# Patient Record
Sex: Male | Born: 1955 | Race: Black or African American | Hispanic: No | Marital: Single | Smoking: Never smoker
Health system: Southern US, Community
[De-identification: ages and names within clinical notes are randomized; demographics above are authoritative.]

## PROBLEM LIST (undated history)

## (undated) DIAGNOSIS — I498 Other specified cardiac arrhythmias: Secondary | ICD-10-CM

## (undated) DIAGNOSIS — R5383 Other fatigue: Secondary | ICD-10-CM

## (undated) DIAGNOSIS — M1710 Unilateral primary osteoarthritis, unspecified knee: Secondary | ICD-10-CM

## (undated) DIAGNOSIS — R609 Edema, unspecified: Secondary | ICD-10-CM

## (undated) DIAGNOSIS — IMO0002 Reserved for concepts with insufficient information to code with codable children: Secondary | ICD-10-CM

## (undated) DIAGNOSIS — I10 Essential (primary) hypertension: Secondary | ICD-10-CM

## (undated) DIAGNOSIS — M25519 Pain in unspecified shoulder: Secondary | ICD-10-CM

## (undated) DIAGNOSIS — F519 Sleep disorder not due to a substance or known physiological condition, unspecified: Secondary | ICD-10-CM

## (undated) DIAGNOSIS — K219 Gastro-esophageal reflux disease without esophagitis: Secondary | ICD-10-CM

## (undated) DIAGNOSIS — M25562 Pain in left knee: Secondary | ICD-10-CM

## (undated) DIAGNOSIS — F209 Schizophrenia, unspecified: Secondary | ICD-10-CM

## (undated) DIAGNOSIS — M25561 Pain in right knee: Secondary | ICD-10-CM

## (undated) DIAGNOSIS — Z79899 Other long term (current) drug therapy: Secondary | ICD-10-CM

## (undated) HISTORY — DX: Schizophrenia, unspecified: F20.9

## (undated) HISTORY — DX: Unilateral primary osteoarthritis, unspecified knee: M17.10

## (undated) HISTORY — DX: Other fatigue: R53.83

## (undated) HISTORY — DX: Gastro-esophageal reflux disease without esophagitis: K21.9

## (undated) HISTORY — PX: HERNIA REPAIR: SHX51

## (undated) HISTORY — DX: Edema, unspecified: R60.9

## (undated) HISTORY — DX: Pain in left knee: M25.562

## (undated) HISTORY — PX: INGUINAL HERNIA REPAIR: SHX194

## (undated) HISTORY — DX: Sleep disorder not due to a substance or known physiological condition, unspecified: F51.9

## (undated) HISTORY — DX: Pain in unspecified shoulder: M25.519

## (undated) HISTORY — DX: Reserved for concepts with insufficient information to code with codable children: IMO0002

## (undated) HISTORY — DX: Other long term (current) drug therapy: Z79.899

## (undated) HISTORY — DX: Other specified cardiac arrhythmias: I49.8

## (undated) HISTORY — DX: Pain in right knee: M25.561

---

## 1999-10-15 ENCOUNTER — Emergency Department (HOSPITAL_COMMUNITY): Admission: EM | Admit: 1999-10-15 | Discharge: 1999-10-15 | Payer: Self-pay | Admitting: Emergency Medicine

## 1999-11-29 ENCOUNTER — Emergency Department (HOSPITAL_COMMUNITY): Admission: EM | Admit: 1999-11-29 | Discharge: 1999-11-29 | Payer: Self-pay | Admitting: Emergency Medicine

## 2000-03-08 ENCOUNTER — Ambulatory Visit (HOSPITAL_COMMUNITY): Admission: RE | Admit: 2000-03-08 | Discharge: 2000-03-08 | Payer: Self-pay | Admitting: Family Medicine

## 2000-03-08 ENCOUNTER — Encounter: Admission: RE | Admit: 2000-03-08 | Discharge: 2000-03-08 | Payer: Self-pay | Admitting: Family Medicine

## 2001-04-25 ENCOUNTER — Encounter: Admission: RE | Admit: 2001-04-25 | Discharge: 2001-04-25 | Payer: Self-pay | Admitting: Family Medicine

## 2001-05-22 ENCOUNTER — Encounter: Admission: RE | Admit: 2001-05-22 | Discharge: 2001-05-22 | Payer: Self-pay | Admitting: Family Medicine

## 2001-08-30 ENCOUNTER — Encounter: Admission: RE | Admit: 2001-08-30 | Discharge: 2001-08-30 | Payer: Self-pay | Admitting: Family Medicine

## 2002-09-17 ENCOUNTER — Encounter: Admission: RE | Admit: 2002-09-17 | Discharge: 2002-09-17 | Payer: Self-pay | Admitting: Family Medicine

## 2003-02-07 ENCOUNTER — Emergency Department (HOSPITAL_COMMUNITY): Admission: EM | Admit: 2003-02-07 | Discharge: 2003-02-07 | Payer: Self-pay | Admitting: Emergency Medicine

## 2003-02-08 ENCOUNTER — Encounter: Admission: RE | Admit: 2003-02-08 | Discharge: 2003-02-08 | Payer: Self-pay | Admitting: Family Medicine

## 2003-02-22 ENCOUNTER — Encounter: Admission: RE | Admit: 2003-02-22 | Discharge: 2003-02-22 | Payer: Self-pay | Admitting: Family Medicine

## 2006-03-25 ENCOUNTER — Ambulatory Visit: Payer: Self-pay | Admitting: Family Medicine

## 2006-04-25 ENCOUNTER — Ambulatory Visit: Payer: Self-pay | Admitting: Family Medicine

## 2006-09-08 DIAGNOSIS — Z8679 Personal history of other diseases of the circulatory system: Secondary | ICD-10-CM

## 2006-09-08 DIAGNOSIS — F209 Schizophrenia, unspecified: Secondary | ICD-10-CM | POA: Insufficient documentation

## 2006-09-08 DIAGNOSIS — Z87898 Personal history of other specified conditions: Secondary | ICD-10-CM | POA: Insufficient documentation

## 2007-08-29 ENCOUNTER — Ambulatory Visit: Payer: Self-pay | Admitting: Family Medicine

## 2007-08-29 ENCOUNTER — Encounter (INDEPENDENT_AMBULATORY_CARE_PROVIDER_SITE_OTHER): Payer: Self-pay | Admitting: Family Medicine

## 2007-08-29 DIAGNOSIS — F519 Sleep disorder not due to a substance or known physiological condition, unspecified: Secondary | ICD-10-CM | POA: Insufficient documentation

## 2007-08-29 LAB — CONVERTED CEMR LAB
Direct LDL: 108 mg/dL — ABNORMAL HIGH
Glucose, Bld: 121 mg/dL — ABNORMAL HIGH (ref 70–99)

## 2007-09-07 ENCOUNTER — Encounter (INDEPENDENT_AMBULATORY_CARE_PROVIDER_SITE_OTHER): Payer: Self-pay | Admitting: Family Medicine

## 2008-02-06 ENCOUNTER — Encounter (INDEPENDENT_AMBULATORY_CARE_PROVIDER_SITE_OTHER): Payer: Self-pay | Admitting: Family Medicine

## 2008-02-09 ENCOUNTER — Ambulatory Visit: Payer: Self-pay | Admitting: Family Medicine

## 2008-02-09 ENCOUNTER — Ambulatory Visit (HOSPITAL_COMMUNITY): Admission: RE | Admit: 2008-02-09 | Discharge: 2008-02-09 | Payer: Self-pay | Admitting: Family Medicine

## 2008-02-09 DIAGNOSIS — K4090 Unilateral inguinal hernia, without obstruction or gangrene, not specified as recurrent: Secondary | ICD-10-CM | POA: Insufficient documentation

## 2008-02-20 ENCOUNTER — Encounter (INDEPENDENT_AMBULATORY_CARE_PROVIDER_SITE_OTHER): Payer: Self-pay | Admitting: Family Medicine

## 2008-03-06 ENCOUNTER — Ambulatory Visit: Payer: Self-pay | Admitting: Family Medicine

## 2008-03-06 ENCOUNTER — Encounter (INDEPENDENT_AMBULATORY_CARE_PROVIDER_SITE_OTHER): Payer: Self-pay | Admitting: Family Medicine

## 2008-03-06 LAB — CONVERTED CEMR LAB: PSA: 1.07 ng/mL (ref 0.10–4.00)

## 2008-03-07 ENCOUNTER — Telehealth (INDEPENDENT_AMBULATORY_CARE_PROVIDER_SITE_OTHER): Payer: Self-pay | Admitting: *Deleted

## 2008-03-13 ENCOUNTER — Encounter (INDEPENDENT_AMBULATORY_CARE_PROVIDER_SITE_OTHER): Payer: Self-pay | Admitting: Family Medicine

## 2008-04-02 ENCOUNTER — Encounter (INDEPENDENT_AMBULATORY_CARE_PROVIDER_SITE_OTHER): Payer: Self-pay | Admitting: Family Medicine

## 2008-04-18 ENCOUNTER — Telehealth (INDEPENDENT_AMBULATORY_CARE_PROVIDER_SITE_OTHER): Payer: Self-pay | Admitting: Family Medicine

## 2008-05-14 ENCOUNTER — Telehealth: Payer: Self-pay | Admitting: *Deleted

## 2008-05-27 ENCOUNTER — Encounter (INDEPENDENT_AMBULATORY_CARE_PROVIDER_SITE_OTHER): Payer: Self-pay | Admitting: Family Medicine

## 2008-05-27 ENCOUNTER — Ambulatory Visit: Payer: Self-pay | Admitting: Family Medicine

## 2008-05-27 ENCOUNTER — Encounter: Admission: RE | Admit: 2008-05-27 | Discharge: 2008-05-27 | Payer: Self-pay | Admitting: Family Medicine

## 2008-05-27 DIAGNOSIS — M1710 Unilateral primary osteoarthritis, unspecified knee: Secondary | ICD-10-CM | POA: Insufficient documentation

## 2008-05-27 DIAGNOSIS — IMO0002 Reserved for concepts with insufficient information to code with codable children: Secondary | ICD-10-CM | POA: Insufficient documentation

## 2008-05-29 ENCOUNTER — Encounter (INDEPENDENT_AMBULATORY_CARE_PROVIDER_SITE_OTHER): Payer: Self-pay | Admitting: Family Medicine

## 2008-06-24 ENCOUNTER — Telehealth (INDEPENDENT_AMBULATORY_CARE_PROVIDER_SITE_OTHER): Payer: Self-pay | Admitting: Family Medicine

## 2008-07-18 ENCOUNTER — Telehealth: Payer: Self-pay | Admitting: *Deleted

## 2008-07-24 ENCOUNTER — Ambulatory Visit: Payer: Self-pay | Admitting: Family Medicine

## 2008-07-31 ENCOUNTER — Encounter (INDEPENDENT_AMBULATORY_CARE_PROVIDER_SITE_OTHER): Payer: Self-pay | Admitting: Family Medicine

## 2008-07-31 ENCOUNTER — Ambulatory Visit: Payer: Self-pay | Admitting: Family Medicine

## 2008-07-31 LAB — CONVERTED CEMR LAB
Cholesterol: 201 mg/dL — ABNORMAL HIGH (ref 0–200)
HDL: 67 mg/dL (ref >39)
LDL Cholesterol: 123 mg/dL — ABNORMAL HIGH (ref 0–99)
Total CHOL/HDL Ratio: 3
Triglycerides: 53 mg/dL (ref <150)
VLDL: 11 mg/dL (ref 0–40)

## 2008-08-01 ENCOUNTER — Encounter (INDEPENDENT_AMBULATORY_CARE_PROVIDER_SITE_OTHER): Payer: Self-pay | Admitting: Family Medicine

## 2008-11-28 ENCOUNTER — Ambulatory Visit: Payer: Self-pay | Admitting: Family Medicine

## 2008-11-28 ENCOUNTER — Encounter (INDEPENDENT_AMBULATORY_CARE_PROVIDER_SITE_OTHER): Payer: Self-pay | Admitting: Family Medicine

## 2008-11-28 DIAGNOSIS — R609 Edema, unspecified: Secondary | ICD-10-CM | POA: Insufficient documentation

## 2008-11-28 LAB — CONVERTED CEMR LAB
Bilirubin Urine: NEGATIVE
Blood in Urine, dipstick: NEGATIVE
Glucose, Urine, Semiquant: NEGATIVE
Ketones, urine, test strip: NEGATIVE
Nitrite: NEGATIVE
Protein, U semiquant: NEGATIVE
Specific Gravity, Urine: 1.025
Urobilinogen, UA: 1
WBC Urine, dipstick: NEGATIVE
pH: 7

## 2008-11-29 LAB — CONVERTED CEMR LAB
ALT: 20 units/L (ref 0–53)
AST: 22 units/L (ref 0–37)
Albumin: 4 g/dL (ref 3.5–5.2)
Alkaline Phosphatase: 83 units/L (ref 39–117)
BUN: 10 mg/dL (ref 6–23)
CO2: 23 meq/L (ref 19–32)
Calcium: 9.4 mg/dL (ref 8.4–10.5)
Chloride: 107 meq/L (ref 96–112)
Creatinine, Ser: 0.91 mg/dL (ref 0.40–1.50)
Glucose, Bld: 87 mg/dL (ref 70–99)
HCT: 39.9 % (ref 39.0–52.0)
Hemoglobin: 13.9 g/dL (ref 13.0–17.0)
MCHC: 34.8 g/dL (ref 30.0–36.0)
MCV: 87.3 fL (ref 78.0–100.0)
Platelets: 257 10*3/uL (ref 150–400)
Potassium: 4.7 meq/L (ref 3.5–5.3)
RBC: 4.57 M/uL (ref 4.22–5.81)
RDW: 13.2 % (ref 11.5–15.5)
Sodium: 141 meq/L (ref 135–145)
TSH: 1.104 microintl units/mL (ref 0.350–4.500)
Total Bilirubin: 0.9 mg/dL (ref 0.3–1.2)
Total Protein: 6.8 g/dL (ref 6.0–8.3)
WBC: 5.3 10*3/uL (ref 4.0–10.5)

## 2008-12-16 ENCOUNTER — Ambulatory Visit: Payer: Self-pay | Admitting: Family Medicine

## 2008-12-18 ENCOUNTER — Encounter (INDEPENDENT_AMBULATORY_CARE_PROVIDER_SITE_OTHER): Payer: Self-pay | Admitting: Family Medicine

## 2009-05-02 ENCOUNTER — Ambulatory Visit: Payer: Self-pay | Admitting: Family Medicine

## 2009-07-05 IMAGING — CR DG KNEE 1-2V*R*
2 series · 2 of 2 positions shown · non-contrast
Comparison: None

CLINICAL DATA: Bilateral knee pain.

RIGHT KNEE - 1-2 VIEW

[view not recorded (1 of 2)]
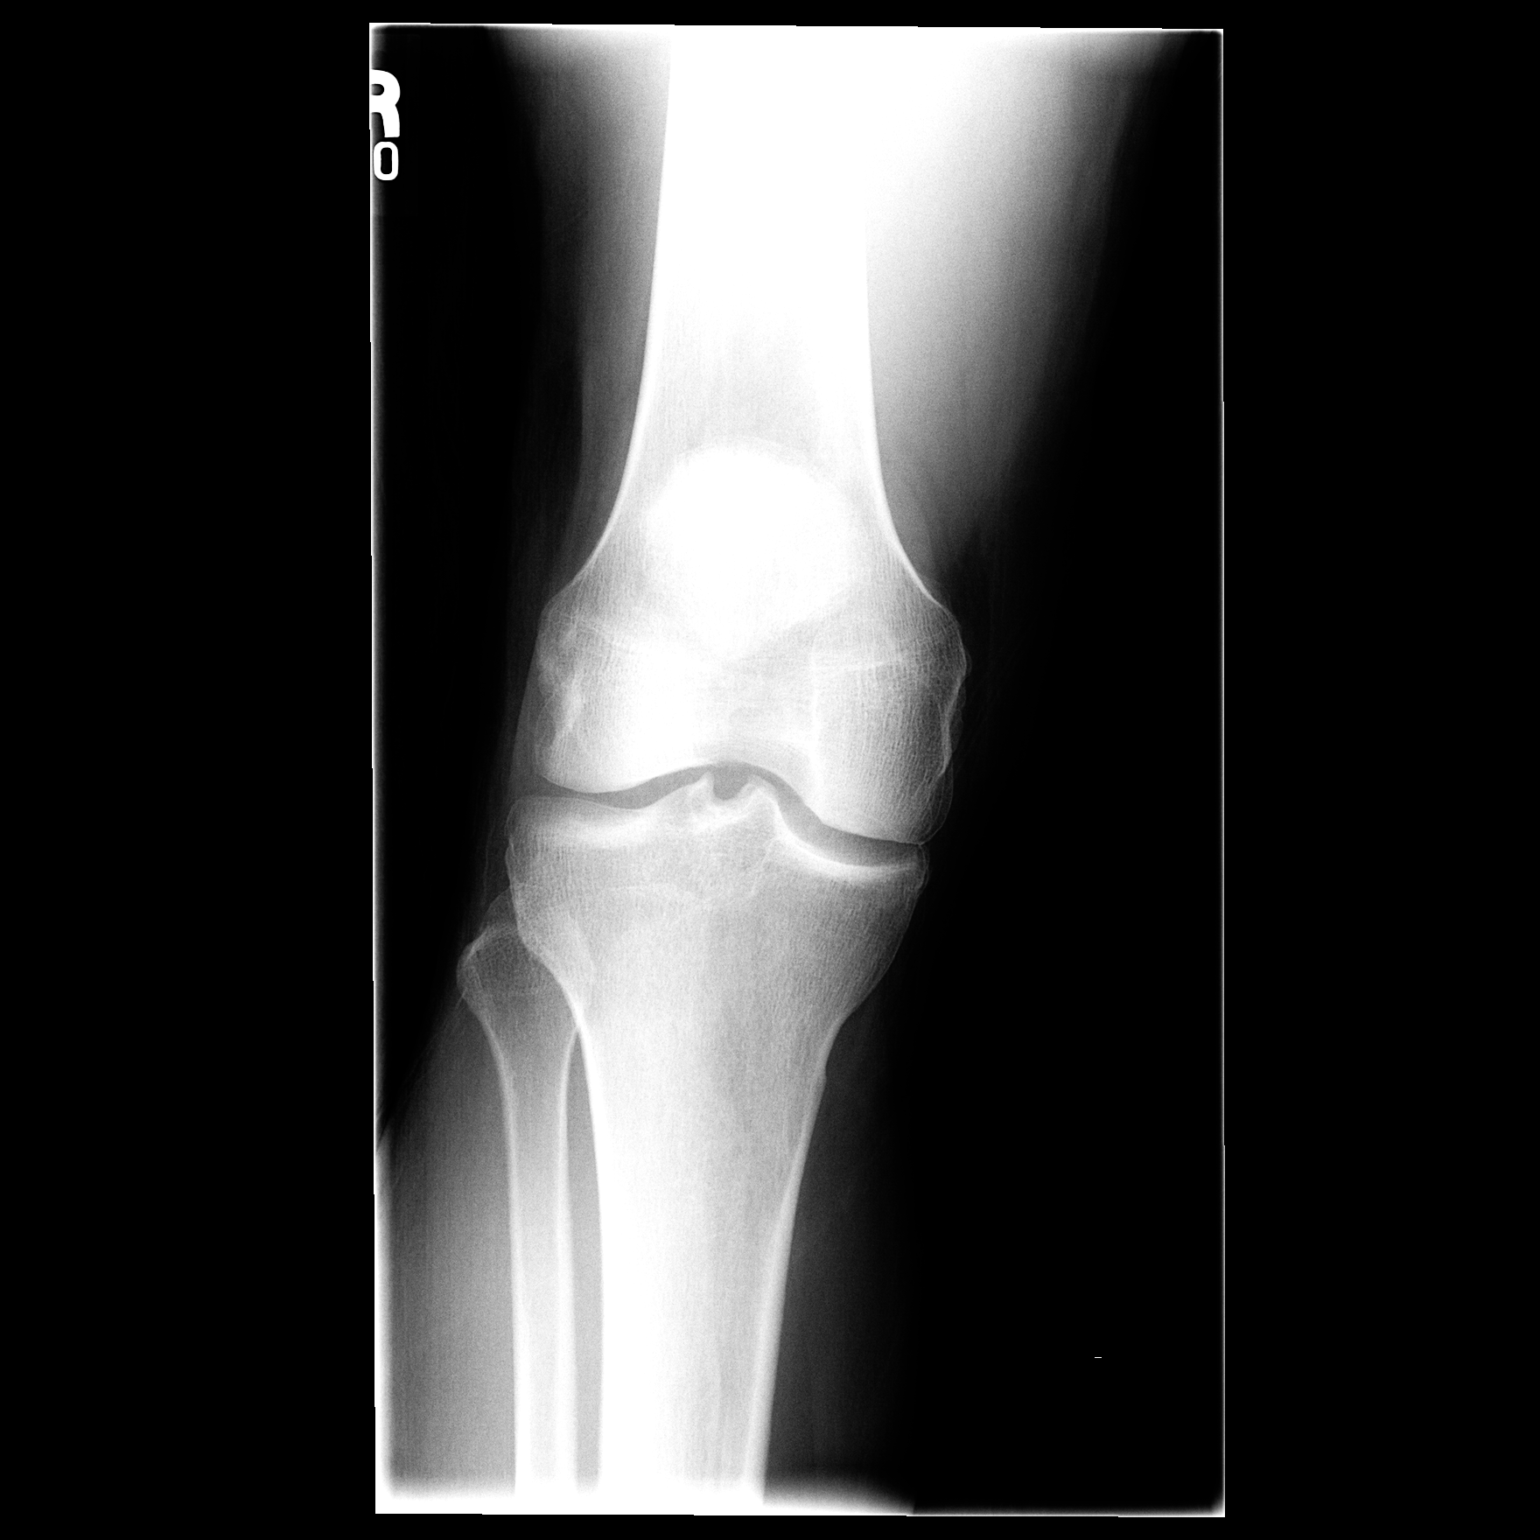

[view not recorded (2 of 2)]
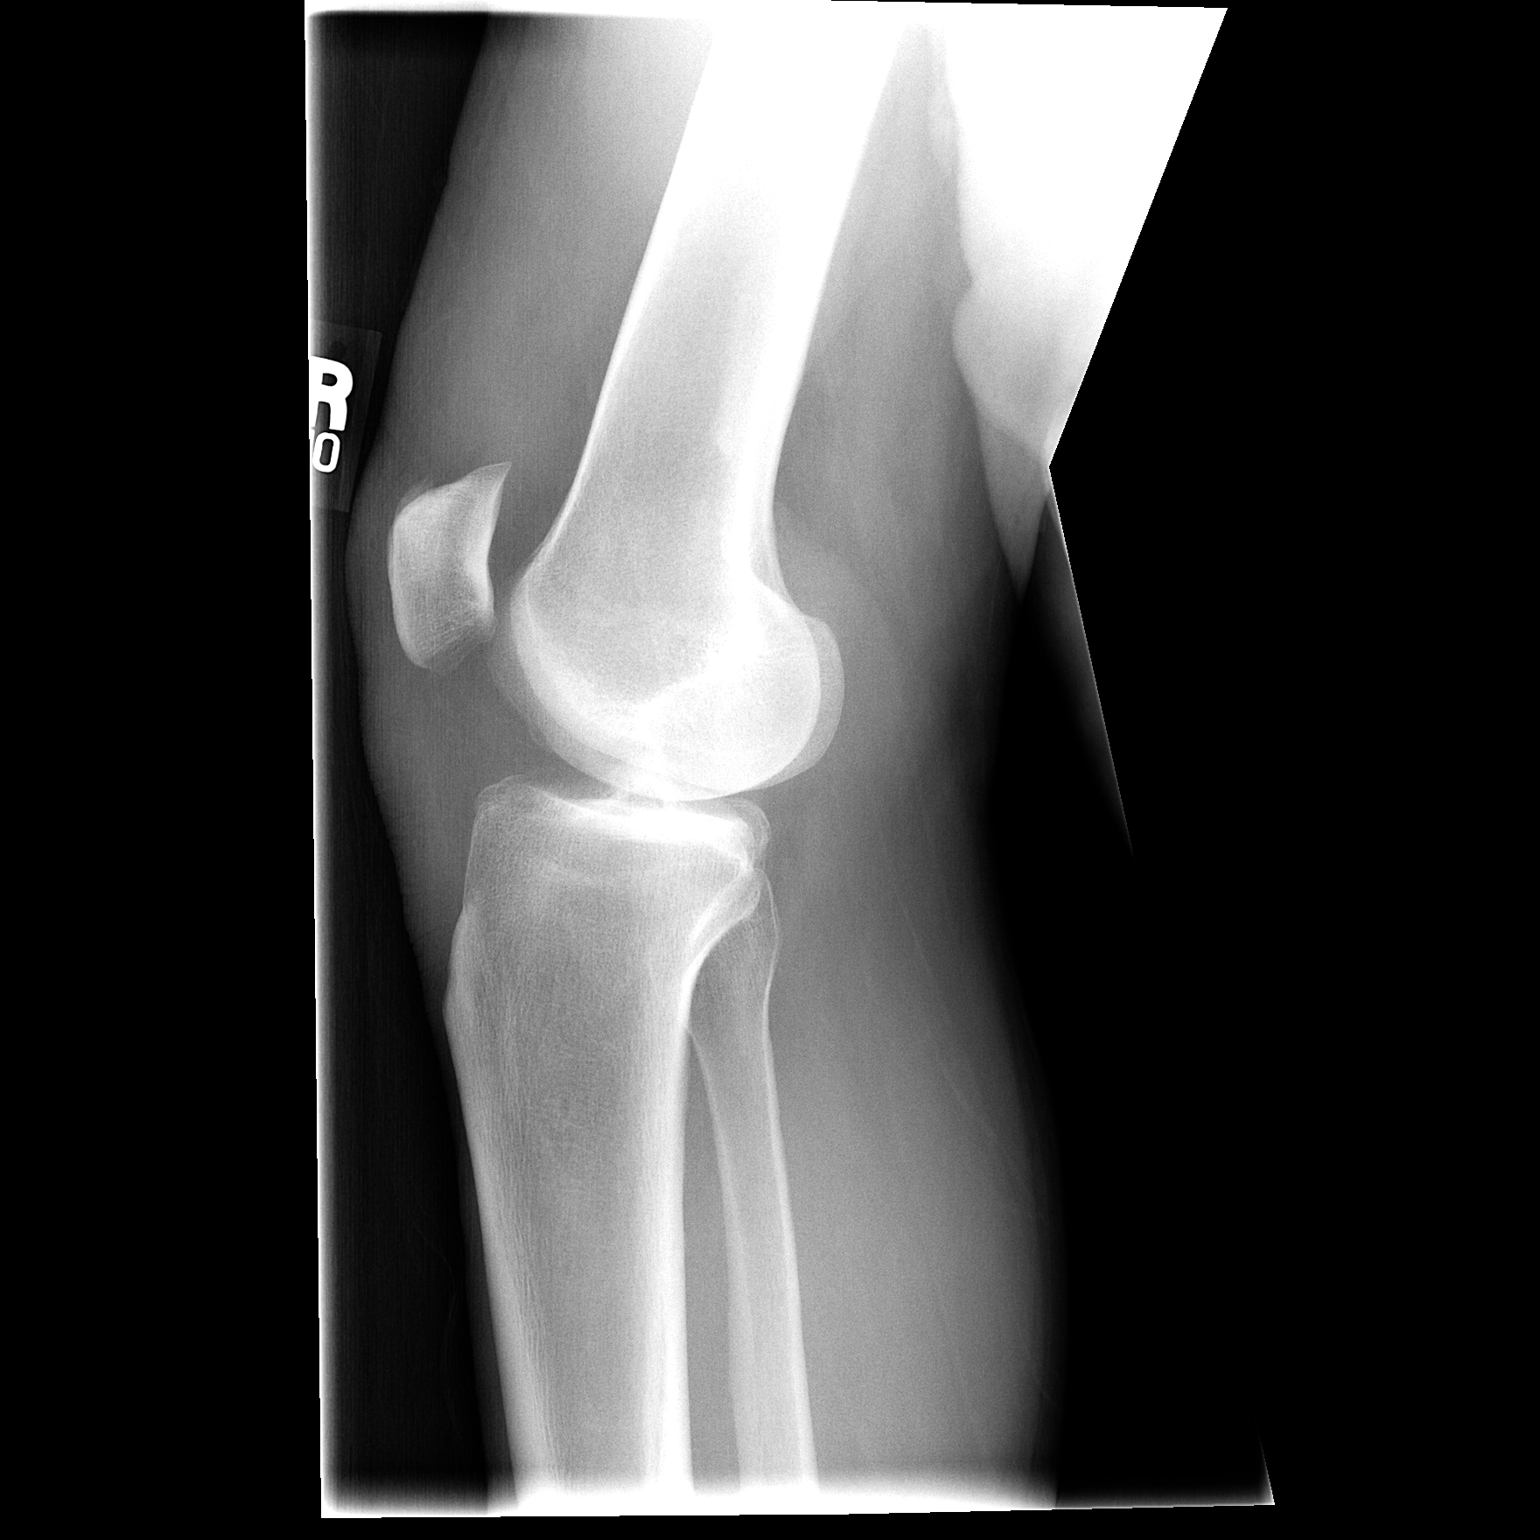

[2 of 2 positions shown; findings below may reference images not displayed]

FINDINGS: Standing views of the knees were obtained.

There is mild spurring of the tibial spines and early spurring of
the superior pole patella.  Medial and lateral joint spaces are
maintained.  There is no fracture or bony lesion. Question small
joint effusion.
IMPRESSION: Mild degenerative change.  No acute bony abnormality.

## 2009-11-22 ENCOUNTER — Encounter: Payer: Self-pay | Admitting: Family Medicine

## 2010-03-12 ENCOUNTER — Encounter: Payer: Self-pay | Admitting: Family Medicine

## 2010-03-12 ENCOUNTER — Ambulatory Visit: Payer: Self-pay | Admitting: Family Medicine

## 2010-03-12 LAB — CONVERTED CEMR LAB
ALT: 14 units/L (ref 0–53)
AST: 19 units/L (ref 0–37)
Albumin: 4.2 g/dL (ref 3.5–5.2)
Alkaline Phosphatase: 57 units/L (ref 39–117)
BUN: 10 mg/dL (ref 6–23)
CO2: 26 meq/L (ref 19–32)
Calcium: 9.1 mg/dL (ref 8.4–10.5)
Chloride: 105 meq/L (ref 96–112)
Creatinine, Ser: 0.95 mg/dL (ref 0.40–1.50)
Direct LDL: 97 mg/dL
Glucose, Bld: 82 mg/dL (ref 70–99)
HCT: 42.1 % (ref 39.0–52.0)
Hemoglobin: 15.2 g/dL (ref 13.0–17.0)
MCHC: 36.1 g/dL — ABNORMAL HIGH (ref 30.0–36.0)
MCV: 86.8 fL (ref 78.0–100.0)
PSA: 0.91 ng/mL (ref 0.10–4.00)
Platelets: 239 10*3/uL (ref 150–400)
Potassium: 4.6 meq/L (ref 3.5–5.3)
RBC: 4.85 M/uL (ref 4.22–5.81)
RDW: 12.9 % (ref 11.5–15.5)
Sodium: 141 meq/L (ref 135–145)
Total Bilirubin: 2 mg/dL — ABNORMAL HIGH (ref 0.3–1.2)
Total Protein: 6.2 g/dL (ref 6.0–8.3)
WBC: 5.4 10*3/uL (ref 4.0–10.5)

## 2010-04-05 ENCOUNTER — Encounter: Payer: Self-pay | Admitting: Family Medicine

## 2010-05-13 ENCOUNTER — Ambulatory Visit: Payer: Self-pay | Admitting: Family Medicine

## 2010-05-13 ENCOUNTER — Telehealth: Payer: Self-pay | Admitting: *Deleted

## 2010-05-14 ENCOUNTER — Emergency Department (HOSPITAL_COMMUNITY): Admission: EM | Admit: 2010-05-14 | Discharge: 2010-05-14 | Payer: Self-pay | Admitting: Emergency Medicine

## 2010-05-14 ENCOUNTER — Telehealth: Payer: Self-pay | Admitting: Family Medicine

## 2010-06-16 ENCOUNTER — Ambulatory Visit: Payer: Self-pay | Admitting: Family Medicine

## 2010-07-08 ENCOUNTER — Encounter: Payer: Self-pay | Admitting: Family Medicine

## 2010-07-17 ENCOUNTER — Other Ambulatory Visit: Payer: Self-pay

## 2010-07-17 ENCOUNTER — Ambulatory Visit
Admission: RE | Admit: 2010-07-17 | Discharge: 2010-07-17 | Payer: Self-pay | Source: Home / Self Care | Attending: Family Medicine | Admitting: Family Medicine

## 2010-07-17 ENCOUNTER — Ambulatory Visit (HOSPITAL_COMMUNITY)
Admission: RE | Admit: 2010-07-17 | Discharge: 2010-07-17 | Payer: Self-pay | Source: Home / Self Care | Admitting: Family Medicine

## 2010-07-21 ENCOUNTER — Encounter: Payer: Self-pay | Admitting: Family Medicine

## 2010-07-31 ENCOUNTER — Ambulatory Visit
Admission: RE | Admit: 2010-07-31 | Discharge: 2010-07-31 | Payer: Self-pay | Source: Home / Self Care | Attending: Internal Medicine | Admitting: Internal Medicine

## 2010-07-31 ENCOUNTER — Encounter: Payer: Self-pay | Admitting: Internal Medicine

## 2010-08-03 LAB — CONVERTED CEMR LAB
CO2: 26 meq/L (ref 19–32)
Chloride: 105 meq/L (ref 96–112)
Sodium: 141 meq/L (ref 135–145)

## 2010-08-10 ENCOUNTER — Ambulatory Visit
Admission: RE | Admit: 2010-08-10 | Discharge: 2010-08-10 | Payer: Self-pay | Source: Home / Self Care | Attending: Internal Medicine | Admitting: Internal Medicine

## 2010-08-11 NOTE — Assessment & Plan Note (Signed)
Summary: Abdominal pain/kf   Vital Signs:  Patient profile:   55 year old male Weight:      164.2 pounds Temp:     98.2 degrees F Pulse rate:   40 / minute BP sitting:   129 / 76  Vitals Entered By: Starleen Blue RN (May 13, 2010 10:50 AM) CC: groin pain Is Patient Diabetic? No Pain Assessment Patient in pain? no        Primary Care Provider:  Everrett Coombe DO  CC:  groin pain.  History of Present Illness: 55 yo M:  1. Groin Pain: Left sided, started yesterday and worsened last night but improved today, pain sharp, "feels like fluid moving around," worse with movement, better with relaxation, had large hard BM yesterday, took Tylenol and "anointment" oil. Denies fever/chills, N/V/D, LE edema, CP, SOB, HA, rash. Patient endorses Hx hernia - previous notes state not seen on exam, but patient has had similar c/o in the past - Rx watchful waiting.  2. Bradycardia: HR 40 today. Hx stable chronic bradycardia to 30-50s, asymptomatic (no CP, HA, dizziness, LOC).  Previous EKG with normal QT interval, no heart block.  Habits & Providers  Alcohol-Tobacco-Diet     Alcohol drinks/day: 0     Tobacco Status: never  Current Medications (verified): 1)  Trazodone Hcl 100 Mg Tabs (Trazodone Hcl) .Marland Kitchen.. 1 By Mouth Qhs 2)  Risperdal 2 Mg Tabs (Risperidone) .... 1.5 Tabs Qhs 3)  Colace 100 Mg Caps (Docusate Sodium) .Marland Kitchen.. 1 By Mouth 2 Times Daily As Needed For Constipation  Allergies (verified): No Known Drug Allergies PMH-FH-SH reviewed for relevance  Review of Systems      See HPI  Physical Exam  General:  Well-developed, well-nourished, in no acute distress; alert, appropriate and cooperative throughout examination. Vitals reviewed. Lungs:  Normal respiratory effort, chest expands symmetrically. Lungs are clear to auscultation, no crackles or wheezes. Heart:  Normal rate and regular rhythm. S1 and S2 normal without gallop, murmur, click, rub or other extra sounds. Abdomen:  BS x  4, soft, + reducable left inguinal hernia. Pulses:  Dorsalis pedis and posterior tibial pulses are full and equal bilaterally. Extremities:  No edema.   Impression & Recommendations:  Problem # 1:  ING HERN W/O MENTION OBST/GANGREN UNILAT/UNSPEC (ICD-550.90) Assessment Deteriorated  Inguinal hernia - reducable. No red flags. Will need surgery. Pain resolved today. Discussed precautions. Will need pre-op eval then surgery referral for hernia.  Orders: [facility]- Est  Level 4 (10272)  Problem # 2:  BRADYCARDIA (ICD-427.89) Assessment: Unchanged  Patient will need to come back for preoperative evaluation with PCP before surgery. States that he has upcoming CPE.  Orders: [facility]- Est  Level 4 (53664)  Complete Medication List: 1)  Trazodone Hcl 100 Mg Tabs (Trazodone hcl) .Marland Kitchen.. 1 by mouth qhs 2)  Risperdal 2 Mg Tabs (Risperidone) .... 1.5 tabs qhs 3)  Colace 100 Mg Caps (Docusate sodium) .Marland Kitchen.. 1 by mouth 2 times daily as needed for constipation  Patient Instructions: 1)  It was nice meeting you today. 2)  Please make an appointment for a pre-perative evaluation with your PCP. 3)  Take stools softeners daily. Prescriptions: COLACE 100 MG CAPS (DOCUSATE SODIUM) 1 by mouth 2 times daily as needed for constipation  #180 x 3   Entered and Authorized by:   Helane Rima DO   Signed by:   Helane Rima DO on 05/13/2010   Method used:   Print then Give to Patient   RxID:  [id number]    Orders Added: 1)  [facility]- Est  Level 4 [30865]

## 2010-08-11 NOTE — Progress Notes (Signed)
Summary: triage   Phone Note Call from Patient Call back at [phone]   Caller: Daughter-tamica Summary of Call: has a pain in lower abd and thinks it's a hernia - offered her to bring him in to wait for work in or tomorrow - wants to know if she can take him to [facility] Initial call taken by: De Nurse,  May 13, 2010 8:40 AM  Follow-up for Phone Call        Told Tamica that I have a 1015 appt.  She will bring him in. Follow-up by: Dennison Nancy RN,  May 13, 2010 9:03 AM

## 2010-08-11 NOTE — Consult Note (Signed)
Summary: Jesse Christensen, OD B dry eye syndrome etc  Jesse Christensen, OD   Imported By: De Nurse 11/28/2009 15:11:07  _____________________________________________________________________  External Attachment:    Type:   Image     Comment:   External Document

## 2010-08-11 NOTE — Progress Notes (Signed)
   Phone Note Call from Patient   Caller: Daughter-Tamika Summary of Call: Daughter called to inform provider that her father was at the ED and may be admitted. Initial call taken by: Abundio Miu,  May 14, 2010 11:26 AM

## 2010-08-11 NOTE — Letter (Signed)
Summary: Generic Letter  [facility]  [address]   [place], [place] [address]   Phone: [phone]  Fax: [phone]    04/05/2010  Jesse Christensen [address] [place], [place]  [address]  Dear [name],  In reviewing your most recent labs on March 12, 2010 there were no abnormalities in your lab work.  Your blood chemistry, blood counts and kidney function were normal.  Your PSA was normal and your LDL (bad cholesterol) was where it needs to be.  If you have any additional questions feel free to contact me at the [facility]. Please remember to follow up with me in 3-4 months.    Sincerely,   Everrett Coombe DO   Appended Document: Generic Letter mailed.

## 2010-08-11 NOTE — Assessment & Plan Note (Signed)
Summary: meet new doc,df   Vital Signs:  Patient profile:   55 year old male Weight:      155.2 pounds Temp:     98.5 degrees F Pulse rate:   48 / minute BP sitting:   106 / 80  (left arm)  Vitals Entered By: Starleen Blue RN (March 12, 2010 1:58 PM) CC: f/u Is Patient Diabetic? No Pain Assessment Patient in pain? no        Primary Linlee Cromie:  Eustaquio Boyden  MD  CC:  f/u.  History of Present Illness: Patient presents with aide who helps him throughout the day.   Patient is a 55yo male new to me with history of schizophrenia  No new concerns today from patient standpoint, however his aide states that he has not been sleeping well.  He denies this and states he has been sleeping fine.  He does not hae trouble falling asleep and does not wake up during the night.  He does not feel tired throughout the day however he does admit to occasional napping.  Of note he has had a 15lb weight loss since last year, however he says he does a lot of work outside and exercises quite a bit  Regarding his previous problems:  Edema- Has improved in lower extemities, still comes and goes occasionally, denies SOB, palpitations, PND H/O stable chronic bradycardia to 30-50s, asymptomatic (no CP, HA, dizziness, LOC).  previous EKG with normal QT interval, no heart block.  Believed to be athletic heart   Current Medications (verified): 1)  Trazodone Hcl 100 Mg Tabs (Trazodone Hcl) .Marland Kitchen.. 1 By Mouth Qhs 2)  Risperdal 2 Mg Tabs (Risperidone) .... 1.5 Tabs Qhs  Allergies (verified): No Known Drug Allergies  Review of Systems       Pertinent positives and negatives noted in HPI, Vitals signs noted   Physical Exam  General:  Well-developed,well-nourished,in no acute distress; alert,appropriate and cooperative throughout examination Neck:  No deformities, masses, or tenderness noted. Lungs:  Normal respiratory effort, chest expands symmetrically. Lungs are clear to auscultation, no crackles  or wheezes. Heart:  Normal rate and regular rhythm. S1 and S2 normal without gallop, murmur, click, rub or other extra sounds. Abdomen:  Bowel sounds positive,abdomen soft and non-tender without masses, organomegaly or hernias noted. Msk:  No deformity or scoliosis noted of thoracic or lumbar spine.   Pulses:  dorsalis pedis and posterior tibial pulses are full and equal bilaterally Extremities:  No clubbing, cyanosis, edema, or deformity noted with normal full range of motion of all joints.   Skin:  Intact without suspicious lesions or rashes   Impression & Recommendations:  Problem # 1:  INSOMNIA-SLEEP DISORDER-UNSPEC (ICD-307.40)  Pt. sleep patterns seem normal per questioning him, however his aide was concerned about his sleep.  Did not see any signs or symptoms of being overly tired.  Will continue on trazodone for now and have him come back in 3-4 months for a complete physical.  Plan to go ahead and draw labs today to discuss at his cpe  Orders: [facility]- Est Level  3 ([address]109)  Problem # 2:  BRADYCARDIA (ICD-427.89)  Stable, he is asymptomatic and has had EKG in the past with no concerning features.  Pt. does a lot of work outdoors and works out 5-6 days per week, so likely an athletic heart.  Orders: [facility]- Est Level  3 (60454)  Problem # 3:  EDEMA (ICD-782.3) Not edematous today, no other signs that would indicate heart  failure.  Will re-assess when he returns for CPE. Orders: Comp Met-[facility] ([id number]) [facility]- Est Level  3 (46962)  Complete Medication List: 1)  Trazodone Hcl 100 Mg Tabs (Trazodone hcl) .Marland Kitchen.. 1 by mouth qhs 2)  Risperdal 2 Mg Tabs (Risperidone) .... 1.5 tabs qhs  Other Orders: Direct LDL-[facility] ([id number]) CBC-[facility] (01027) PSA-[facility] [phone])  Patient Instructions: 1)  It was nice meeting you today. 2)  I will contact you by mail to let you know the results of your labs from today.  I would like to see you back in 3-4 months for a complete physical. 3)  Please  call if you need to be seen sooner    Vital Signs:  Patient profile:   55 year old male Weight:      155.2 pounds Temp:     98.5 degrees F Pulse rate:   48 / minute BP sitting:   106 / 80  (left arm)  Vitals Entered By: Starleen Blue RN (March 12, 2010 1:58 PM)

## 2010-08-13 ENCOUNTER — Encounter: Payer: Self-pay | Admitting: Internal Medicine

## 2010-08-13 NOTE — Assessment & Plan Note (Signed)
Summary: f/u hernia   Vital Signs:  Patient profile:   55 year old male Height:      68.5 inches Weight:      158 pounds BMI:     23.76 Temp:     98.3 degrees F oral BP sitting:   120 / 70  (right arm) Cuff size:   regular  Vitals Entered By: Tessie Fass CMA (June 16, 2010 2:36 PM) CC: F/U Pain Assessment Patient in pain? no        Primary Amairany Schumpert:  Everrett Coombe DO  CC:  F/U.  History of Present Illness: Patient here following up after being seen in ED on 11/3.  Patient was seen on 11/2 for hernia, which was reducible and subsequently seen in ED on 11/3 for the same problem.  Hernia reduced once again at that time.  Since that time the hernia has not bothered him.  He states that his bowel movements have been ok and he has been taking a stool softener.  He denies blood in his stool, pain that he has had with the hernia in the past, and/or nausea, vomiting.  Current Medications (verified): 1)  Trazodone Hcl 100 Mg Tabs (Trazodone Hcl) .Marland Kitchen.. 1 By Mouth Qhs 2)  Risperdal 2 Mg Tabs (Risperidone) .... 1.5 Tabs Qhs 3)  Colace 100 Mg Caps (Docusate Sodium) .Marland Kitchen.. 1 By Mouth 2 Times Daily As Needed For Constipation  Allergies (verified): No Known Drug Allergies  Review of Systems       Pertinent positives and negatives noted in HPI, Vitals signs noted   Physical Exam  General:  Well-developed,well-nourished,in no acute distress; alert,appropriate and cooperative throughout examination Head:  Normocephalic and atraumatic without obvious abnormalities. No apparent alopecia or balding. Neck:  No deformities, masses, or tenderness noted. Lungs:  Normal respiratory effort, chest expands symmetrically. Lungs are clear to auscultation, no crackles or wheezes. Heart:  Normal rate and regular rhythm. S1 and S2 normal without gallop, murmur, click, rub or other extra sounds. Abdomen:  BS x4, abdomen soft, nt, Left inguinal hernia present, reducible   Impression &  Recommendations:  Problem # 1:  ING HERN W/O MENTION OBST/GANGREN UNILAT/UNSPEC (ICD-550.90) Surgery seen in ED but patient has not scheduled f/u with them.  Will refer to general surgery for hernia repair.  No signs of obstruction or incarceration at this time.  Instructed patient to make f/u appointment for pre-op in the near future.  Given red flags of when to return to ED. Orders: Surgical Referral (Surgery) [facility]- Est Level  3 (04540)  Complete Medication List: 1)  Trazodone Hcl 100 Mg Tabs (Trazodone hcl) .Marland Kitchen.. 1 by mouth qhs 2)  Risperdal 2 Mg Tabs (Risperidone) .... 1.5 tabs qhs 3)  Colace 100 Mg Caps (Docusate sodium) .Marland Kitchen.. 1 by mouth 2 times daily as needed for constipation  Patient Instructions: 1)  It was nice seeing you again today. 2)  I have sent over a referral for surgery for you. 3)  I would like to see you back in a couple of weeks for a complete physical/pre-op exam.   4)  If you have pain in your groin again, no bowel movements, blood in your stool, nausea or vomiting go to the emergency room 5)  Take Care.   Orders Added: 1)  Surgical Referral [Surgery] 2)  [facility]- Est Level  3 [[id number]]

## 2010-08-13 NOTE — Assessment & Plan Note (Signed)
Summary: low heart rate/[place]/matthews   Vital Signs:  Patient profile:   55 year old male Height:      68.5 inches Weight:      162 pounds O2 Sat:      99 % on Room air Temp:     98.6 degrees F oral Pulse rate:   39 / minute BP sitting:   145 / 66  (left arm)  Vitals Entered By: Tessie Fass CMA (July 17, 2010 10:55 AM)  O2 Flow:  Room air CC: bradycardia Pain Assessment Patient in pain? no        Primary Care Provider:  Everrett Coombe DO  CC:  bradycardia.  History of Present Illness: 55 yo M:  1. Bradycardia: Patient was sent from [facility] for evaluation of HR 40s today. Hx stable chronic bradycardia to 30-50s, asymptomatic (no CP, HA, dizziness, LOC).  Previous EKG with normal QT interval, no heart block. On no HR lowering medications. Rx Risperdol, Trazodone, Colace.  Current Medications (verified): 1)  Trazodone Hcl 100 Mg Tabs (Trazodone Hcl) .Marland Kitchen.. 1 By Mouth Qhs 2)  Risperdal 2 Mg Tabs (Risperidone) .... 1.5 Tabs Qhs 3)  Colace 100 Mg Caps (Docusate Sodium) .Marland Kitchen.. 1 By Mouth 2 Times Daily As Needed For Constipation  Allergies (verified): No Known Drug Allergies PMH-FH-SH reviewed for relevance  Review of Systems      See HPI  Physical Exam  General:  Well-developed,well-nourished, in no acute distress; alert, appropriate and cooperative throughout examination. Vitals reviewed. Lungs:  Normal respiratory effort, chest expands symmetrically. Lungs are clear to auscultation, no crackles or wheezes. Heart:  Normal rate and regular rhythm. S1 and S2 normal without gallop, murmur, click, rub or other extra sounds.   Impression & Recommendations:  Problem # 1:  BRADYCARDIA (ICD-427.89) Assessment Unchanged Asymptomatic. EKG unchanged. Since patient with possible upcoming hernia surgery, will refer to cardiology for evaluation and clearance. Orders: EKG- [facility] (EKG) [facility]- Est Level  3 (84696) Cardiology Referral (Cardiology)  Complete Medication List: 1)  Trazodone  Hcl 100 Mg Tabs (Trazodone hcl) .Marland Kitchen.. 1 by mouth qhs 2)  Risperdal 2 Mg Tabs (Risperidone) .... 1.5 tabs qhs 3)  Colace 100 Mg Caps (Docusate sodium) .Marland Kitchen.. 1 by mouth 2 times daily as needed for constipation  Patient Instructions: 1)  It was nice to see you today. 2)  You look great today and I don't think that we need to be too concerned about your low heart rate. However, because you may have surgery in the future, I would like for you to be evaluated by cardiology for a pre-operative evaluation.   Orders Added: 1)  EKG- [facility] [EKG] 2)  [facility]- Est Level  3 [29528] 3)  Cardiology Referral [Cardiology]

## 2010-08-13 NOTE — Assessment & Plan Note (Addendum)
Summary: np6/bradycardia/preop clearance      Allergies Added: NKDA  Visit Type:  Follow-up Primary Provider:  Everrett Coombe DO   History of Present Illness: Patient is a 55 year old who was referred for evaluation of bradycardia.  He is being evaluated for hernia surgery. The patient denies dizziness.  NO chest pain.  No SOB.  He is fairly acitve without problems.  Problems Prior to Update: 1)  Encounter For Long-term Use of Other Medications  (ICD-V58.69) 2)  Health Maintenance Exam  (ICD-V70.0) 3)  Bradycardia  (ICD-427.89) 4)  Schizophrenia  (ICD-295.90) 5)  Edema  (ICD-782.3) 6)  Osteoarthritis, Knee  (ICD-715.96) 7)  Special Screening For Malignant Neoplasms Colon  (ICD-V76.51) 8)  Special Screening Malignant Neoplasm of Prostate  (ICD-V76.44) 9)  Ing Hern w/o Mention Obst/gangren Unilat/unspec  (ICD-550.90) 10)  Insomnia-sleep Disorder-unspec  (ICD-307.40)  Current Medications (verified): 1)  Trazodone Hcl 100 Mg Tabs (Trazodone Hcl) .Marland Kitchen.. 1 By Mouth Qhs 2)  Risperdal 2 Mg Tabs (Risperidone) .... 1.5 Tabs Qhs 3)  Colace 100 Mg Caps (Docusate Sodium) .Marland Kitchen.. 1 By Mouth 2 Times Daily As Needed For Constipation  Allergies (verified): No Known Drug Allergies  Past History:  Family History: Last updated: 08/29/2007 mom--HTN, borderline diabetes uncle--died of heart disease 42 brother-healthy  Social History: Last updated: 05/27/2008 divorced in 1991.  Unemployed.  Non-smoker, non-drinker.;has two adult children - son  & daughter ; Lives with mother and brother.  Formerly in [employer].; works out 6 times per week  Risk Factors: Alcohol Use: 0 (05/13/2010)  Risk Factors: Smoking Status: never (05/13/2010)  Past medical, surgical, family and social histories (including risk factors) reviewed, and no changes noted (except as noted below).  Past Medical History: Reviewed history from 12/18/2008 and no changes required. Current Problems:  EDEMA  (ICD-782.3) OSTEOARTHRITIS, KNEE (ICD-715.96) SPECIAL SCREENING FOR MALIGNANT NEOPLASMS COLON (ICD-V76.51) SPECIAL SCREENING MALIGNANT NEOPLASM OF PROSTATE (ICD-V76.44) ING HERN W/O MENTION OBST/GANGREN UNILAT/UNSPEC (ICD-550.90) INSOMNIA-SLEEP DISORDER-UNSPEC (ICD-307.40) SCHIZOPHRENIA (ICD-295.90) BRADYCARDIA (ICD-427.89)  Past Surgical History: Reviewed history from 12/18/2008 and no changes required. none  Family History: Reviewed history from 08/29/2007 and no changes required. mom--HTN, borderline diabetes uncle--died of heart disease 60 brother-healthy  Social History: Reviewed history from 05/27/2008 and no changes required. divorced in 52.  Unemployed.  Non-smoker, non-drinker.;has two adult children - son  & daughter ; Lives with mother and brother.  Formerly in [employer].; works out 6 times per week  Review of Systems       All systems reviewed.  Neg to the above problem except as noted.  Vital Signs:  Patient profile:   55 year old male Height:      68.5 inches Weight:      165.50 pounds BMI:     24.89 Pulse rate:   42 / minute BP sitting:   122 / 72  (left arm) Cuff size:   regular  Vitals Entered By: Micki Riley CNA (July 31, 2010 2:46 PM)  Physical Exam  Additional Exam:  Patient is in NAD HEENT:  Normocephalic, atraumatic. EOMI, PERRLA.  Neck: JVP is normal. No thyromegaly. No bruits.  Lungs: clear to auscultation. No rales no wheezes.  Heart: Regular rate and rhythm. Normal S1, S2. No S3.   No significant murmurs. PMI not displaced.  Abdomen:  Supple, nontender. Normal bowel sounds. No masses. No hepatomegaly.  Extremities:   Good distal pulses throughout. No lower extremity edema.  Musculoskeletal :moving all extremities.  Neuro:   alert and oriented x3.  EKG  Procedure date:  07/31/2010  Findings:      Sinus bradycardia 42 bpm.  PACs.  First degree AV block.  Impression & Recommendations:  Problem # 1:  BRADYCARDIA  (ICD-427.89) patient is asymptomatic.  I would recomm checking  TSH.  Also set up for a holter  Continue usual acitviities. Orders: T-Basic Metabolic Panel [phone]) T-TSH [phone]) EKG w/ Interpretation (93000) Holter (Holter)  Patient Instructions: 1)  Your physician recommends that you schedule a follow-up appointment in: 12 months with Dr Tenny Craw 2)  Your physician recommends that you have lab work today 3)  Your physician recommends that you continue on your current medications as directed. Please refer to the Current Medication list given to you today. 4)  Your physician has recommended that you wear a holter monitor.  Holter monitors are medical devices that record the heart's electrical activity. Doctors most often use these monitors to diagnose arrhythmias. Arrhythmias are problems with the speed or rhythm of the heartbeat. The monitor is a small, portable device. You can wear one while you do your normal daily activities. This is usually used to diagnose what is causing palpitations/syncope (passing out).  Appended Document: np6/bradycardia/preop clearance TSH normal Holter monitor:  Average HR 40 bpm.  Longest pause 3 seconds Treamill test (08/14/10):  Patient exercised in Bruce protocol for 7 minutes 20 sec.  Peak HR 116 bpm.  (70% predicted maximal)  Peak BP 214/112.  No EKG changes.  Stopped due to fatigue.  Overall at low risk for major cardiac event.  Able to increase heart rate.  OK to proceed with surgery.  Avoid agents that slow HR.

## 2010-08-13 NOTE — Consult Note (Signed)
Summary: Donell Beers MD  Donell Beers MD   Imported By: Bradly Bienenstock 07/09/2010 16:02:21  _____________________________________________________________________  External Attachment:    Type:   Image     Comment:   External Document

## 2010-08-14 ENCOUNTER — Encounter: Payer: Self-pay | Admitting: Internal Medicine

## 2010-08-14 ENCOUNTER — Encounter (INDEPENDENT_AMBULATORY_CARE_PROVIDER_SITE_OTHER): Payer: Medicaid Other | Admitting: Internal Medicine

## 2010-08-14 ENCOUNTER — Telehealth: Payer: Self-pay | Admitting: Internal Medicine

## 2010-08-14 ENCOUNTER — Encounter: Payer: Medicaid Other | Admitting: Internal Medicine

## 2010-08-14 ENCOUNTER — Encounter (HOSPITAL_COMMUNITY): Payer: Medicaid Other | Attending: Internal Medicine

## 2010-08-14 DIAGNOSIS — I498 Other specified cardiac arrhythmias: Secondary | ICD-10-CM

## 2010-08-14 DIAGNOSIS — Z01812 Encounter for preprocedural laboratory examination: Secondary | ICD-10-CM | POA: Insufficient documentation

## 2010-08-14 DIAGNOSIS — K4090 Unilateral inguinal hernia, without obstruction or gangrene, not specified as recurrent: Secondary | ICD-10-CM | POA: Insufficient documentation

## 2010-08-14 LAB — CBC
HCT: 42.8 % (ref 39.0–52.0)
Hemoglobin: 15.1 g/dL (ref 13.0–17.0)
MCH: 30.6 pg (ref 26.0–34.0)
MCHC: 35.3 g/dL (ref 30.0–36.0)

## 2010-08-14 LAB — COMPREHENSIVE METABOLIC PANEL
ALT: 19 U/L (ref 0–53)
AST: 31 U/L (ref 0–37)
CO2: 28 mEq/L (ref 19–32)
Calcium: 9.4 mg/dL (ref 8.4–10.5)
Chloride: 108 mEq/L (ref 96–112)
GFR calc Af Amer: >60 mL/min (ref >60)
GFR calc non Af Amer: >60 mL/min (ref >60)
Sodium: 143 mEq/L (ref 135–145)

## 2010-08-14 LAB — DIFFERENTIAL
Lymphocytes Relative: 29 % (ref 12–46)
Monocytes Absolute: 0.4 10*3/uL (ref 0.1–1.0)
Monocytes Relative: 6 % (ref 3–12)
Neutro Abs: 4.7 10*3/uL (ref 1.7–7.7)

## 2010-08-14 LAB — URINALYSIS, ROUTINE W REFLEX MICROSCOPIC
Hgb urine dipstick: NEGATIVE
Ketones, ur: NEGATIVE mg/dL
Protein, ur: NEGATIVE mg/dL
Urine Glucose, Fasting: NEGATIVE mg/dL

## 2010-08-17 ENCOUNTER — Ambulatory Visit (HOSPITAL_COMMUNITY): Payer: Medicaid Other

## 2010-08-17 ENCOUNTER — Other Ambulatory Visit (HOSPITAL_COMMUNITY): Payer: Self-pay | Admitting: General Surgery

## 2010-08-17 ENCOUNTER — Encounter (HOSPITAL_COMMUNITY): Payer: Medicaid Other

## 2010-08-17 ENCOUNTER — Ambulatory Visit (HOSPITAL_COMMUNITY)
Admission: RE | Admit: 2010-08-17 | Discharge: 2010-08-17 | Disposition: A | Discharge status: Home / Self Care | Payer: Medicaid Other | Source: Ambulatory Visit | Attending: General Surgery | Admitting: General Surgery

## 2010-08-17 ENCOUNTER — Encounter: Payer: Self-pay | Admitting: *Deleted

## 2010-08-17 DIAGNOSIS — K4090 Unilateral inguinal hernia, without obstruction or gangrene, not specified as recurrent: Secondary | ICD-10-CM | POA: Insufficient documentation

## 2010-08-17 DIAGNOSIS — Z01811 Encounter for preprocedural respiratory examination: Secondary | ICD-10-CM

## 2010-08-19 NOTE — Progress Notes (Signed)
Summary: Schedule treadmill   Phone Note Outgoing Call   Call placed by: Dossie Arbour, RN, BSN,  August 14, 2010 9:58 AM Summary of Call: Pt. needs to be scheduled for treadmill per Dr. Tenny Craw.  Left message to call back  Follow-up for Phone Call        Spoke with pt's daughter, Kandice Moos, who is with pt.  Gave her information regarding need for treadmill.  She states pt is scheduled for surgery on Monday.  Discussed with Dr. Tenny Craw who will do pt's treadmill today at 2:00. Daughter verbally given pt instructions for stress test and will have pt here today for test.  Call back number for daughter is [phone] Follow-up by: Dossie Arbour, RN, BSN,  August 14, 2010 10:43 AM

## 2010-08-19 NOTE — Procedures (Signed)
Summary: summary report  summary report   Imported By: Mirna Mires 08/14/2010 12:07:22  _____________________________________________________________________  External Attachment:    Type:   Image     Comment:   External Document

## 2010-08-25 NOTE — Op Note (Signed)
NAMEERICA, OSUNA            ACCOUNT NO.:  [id number]  MEDICAL RECORD NO.:  [id number]           PATIENT TYPE:  O  LOCATION:  [place]                         FACILITY:  [facility]  PHYSICIAN:  Almond Lint, MD       DATE OF BIRTH:  12-27-1955  DATE OF PROCEDURE:  08/17/2010 DATE OF DISCHARGE:                              OPERATIVE REPORT   PREOPERATIVE DIAGNOSIS:  Left inguinal hernia.  POSTOPERATIVE DIAGNOSIS:  Left inguinal hernia.  PROCEDURE PERFORMED:  Left inguinal herniorrhaphy with mesh.  SURGEON:  Almond Lint, MD.  ANESTHESIA:  General and local.  FINDINGS:  Left inguinal hernia, indirect with large adherent cord lipoma.  SPECIMEN:  None.  ESTIMATED BLOOD LOSS:  Minimal.  COMPLICATIONS:  None known.  PROCEDURE:  Mr. Chlebowski was identified in the holding area and taken to the operating room where he was placed supine on the operating room table.  General anesthesia was induced.  The groin was clipped, prepped and draped in a sterile fashion.  A time-out was performed according to the surgical safety checklist.  When all was correct, we continued.  The anterior-superior iliac spine was identified as well as the pubic symphysis.  An obliquely oriented transverse incision was made approximately 5 cm in length, one third of the distance from the pubic symphysis.  This was done after infiltrating with local anesthesia.  The subcutaneous tissues were divided with cautery.  A Weitlaner self- retaining retractor was placed to assist with visualization.  Scarpa's fascia was opened with cautery as well.  The external oblique was cleaned off superficially with a Kittner.    The external oblique was then incised with #15 blade in the direction of the fibers.  Metzenbaum scissors were used to elevate the fascia from the underlying tissues and opened up the fascia to the external inguinal ring inferomedially and superolaterally extending towards the anterior-superior iliac  spine. The ilioinguinal nerve was identified and sharply dissected free from underlying tissues.  This was then protected behind the inferior aspect of the external oblique.  Both edges were elevated with hemostats, and the undersurface was cleaned off with a Kittner.  Digital dissection was then used to elevate the spermatic cord and the hernia.    The structures were all extremely adherent.  The spermatic cord structures were able to be identified and separated away from the hernia sac and a large cord lipoma.  The hernia sac was opened.  There were no apparent structures adherent on the inside of the sac.  There were no intra-abdominal contents coming out into the sac.  The cord lipoma, however, on the outside of the sac, was very adherent and difficult to dissect away. This tore the sac in several places.  Eventually, the cord lipoma was able to be stripped away all the way up to the level of the internal ring, and this was removed as it was extremely large.  The hernia sac was then closed with a 2-0 silk pursestring suture.  This was done under direct visualization, taking care not to incorporate any intra-abdominal contents in the suture.  The sac was then trimmed away with Metzenbaum  scissors.  This was allowed to fall back into the abdomen.    The 3 x 6 inch UltraPro mesh was then selected and cut to the appropriate size. The medial aspect was rounded off, and the tails were cut.  The inferior shelving edge was run with a 2-0 Prolene.  The superior shelving edge was then run with interrupted Prolene, taking care to lay the mesh very flat underneath the external oblique.  The tails were then tucked underneath the external oblique laterally and secured to one another with a 2-0 Prolene.  This was done over DeBakey forceps to make sure that the opening was not too tight.  The cord was then allowed to lay back down flat, and the ilioinguinal nerve was placed back on the inside of  the external oblique.  The On-Q needle was advanced through the abdominal wall and underneath the fascia.  Fascia was then closed with 2-0 Vicryl in a running fashion, taking care not to let any of the underlying tissues get pulled up into the stitch.  Once this was complete, the area was irrigated.  Scarpa's fascia was then closed with a running 3-0 Vicryl.  The skin was then closed using interrupted 3-0 Vicryl deep dermal sutures and running 4-0 Monocryl in subcuticular fashion.  The wound was cleaned, dried and dressed with Dermabond.  The catheter was advanced through the On-Q needle and bolused with 4 mL of local anesthetic.  This was then secured with Steri-Strips and Tegaderm.  The patient was awakened from anesthesia and taken to the PACU in stable condition.  Needle, sponge, and instrument counts were correct x2.     Almond Lint, MD     FB/MEDQ  D:  08/17/2010  T:  08/17/2010  Job:  [id number]  Electronically Signed by Almond Lint MD on 08/19/2010 12:26:34 PM

## 2011-09-25 IMAGING — CR DG CHEST 2V
2 series · 2 of 2 positions shown · non-contrast
Comparison: None.

CLINICAL DATA: Preoperative respiratory exam.  Hernia.

CHEST - 2 VIEW

[view not recorded (1 of 2)]
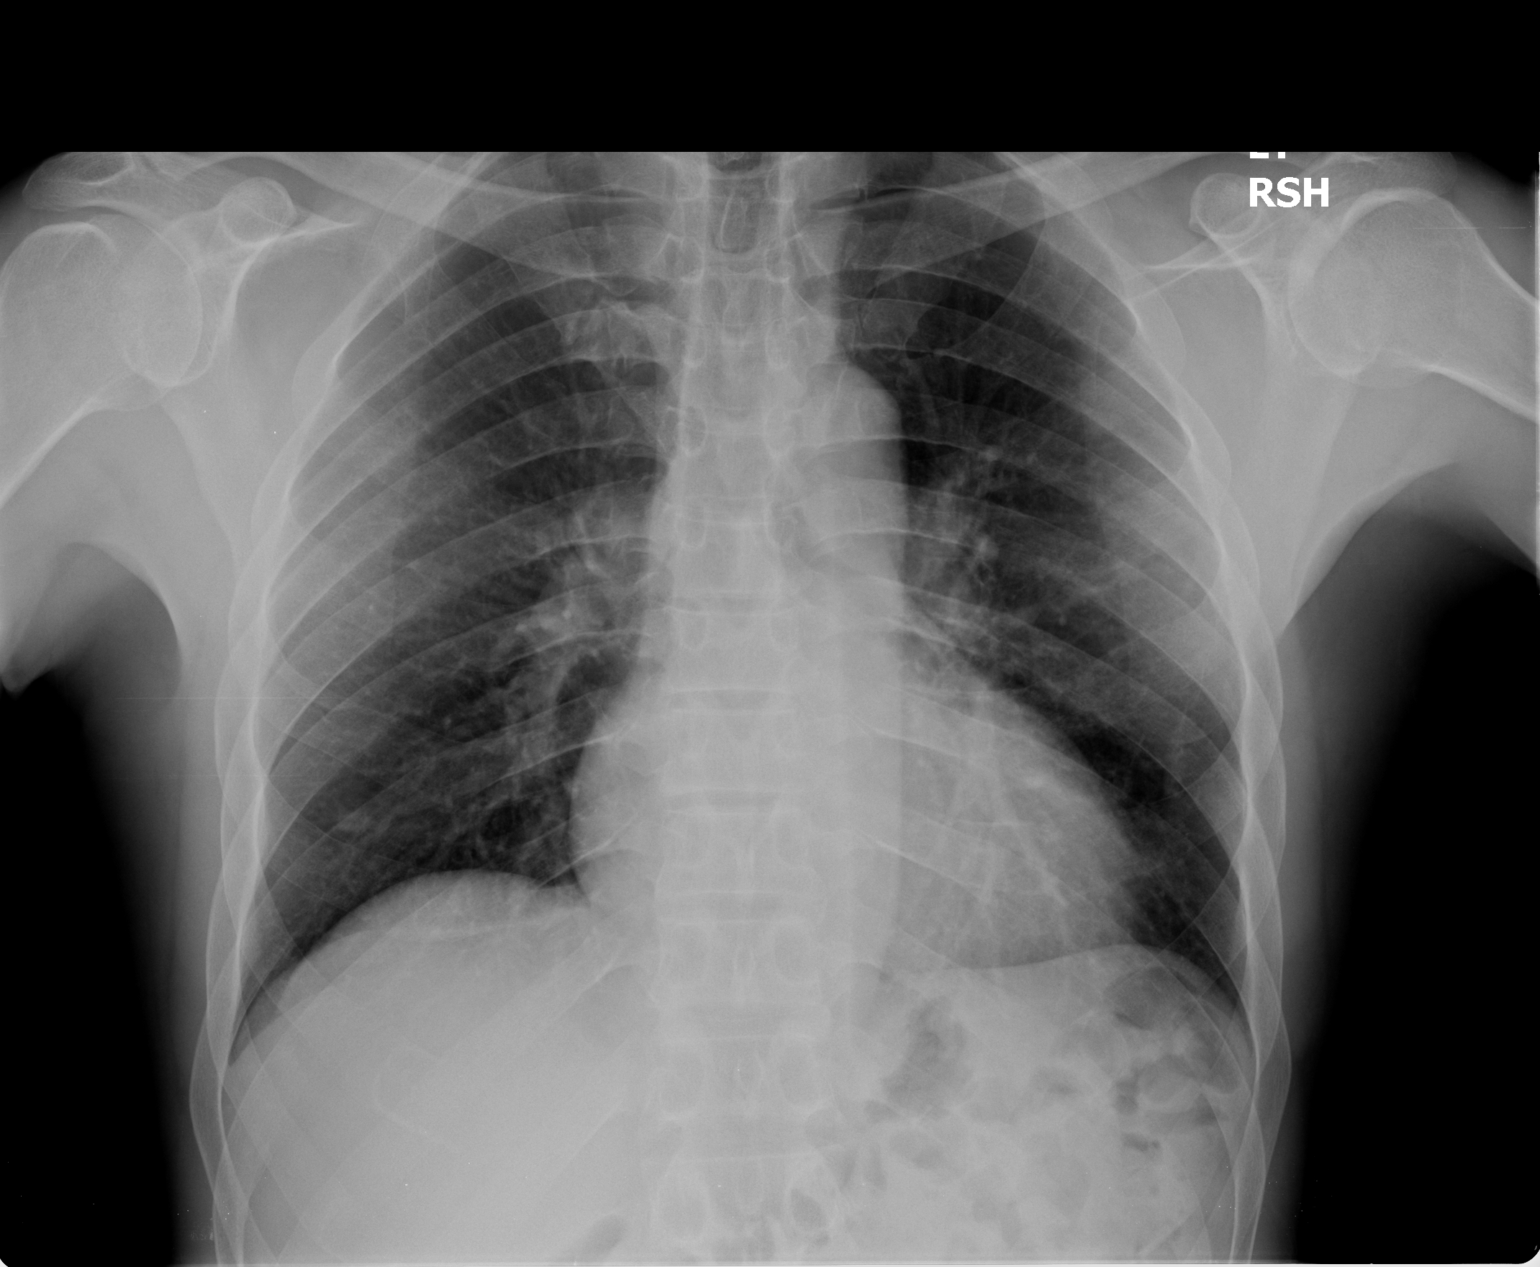

[view not recorded (2 of 2)]
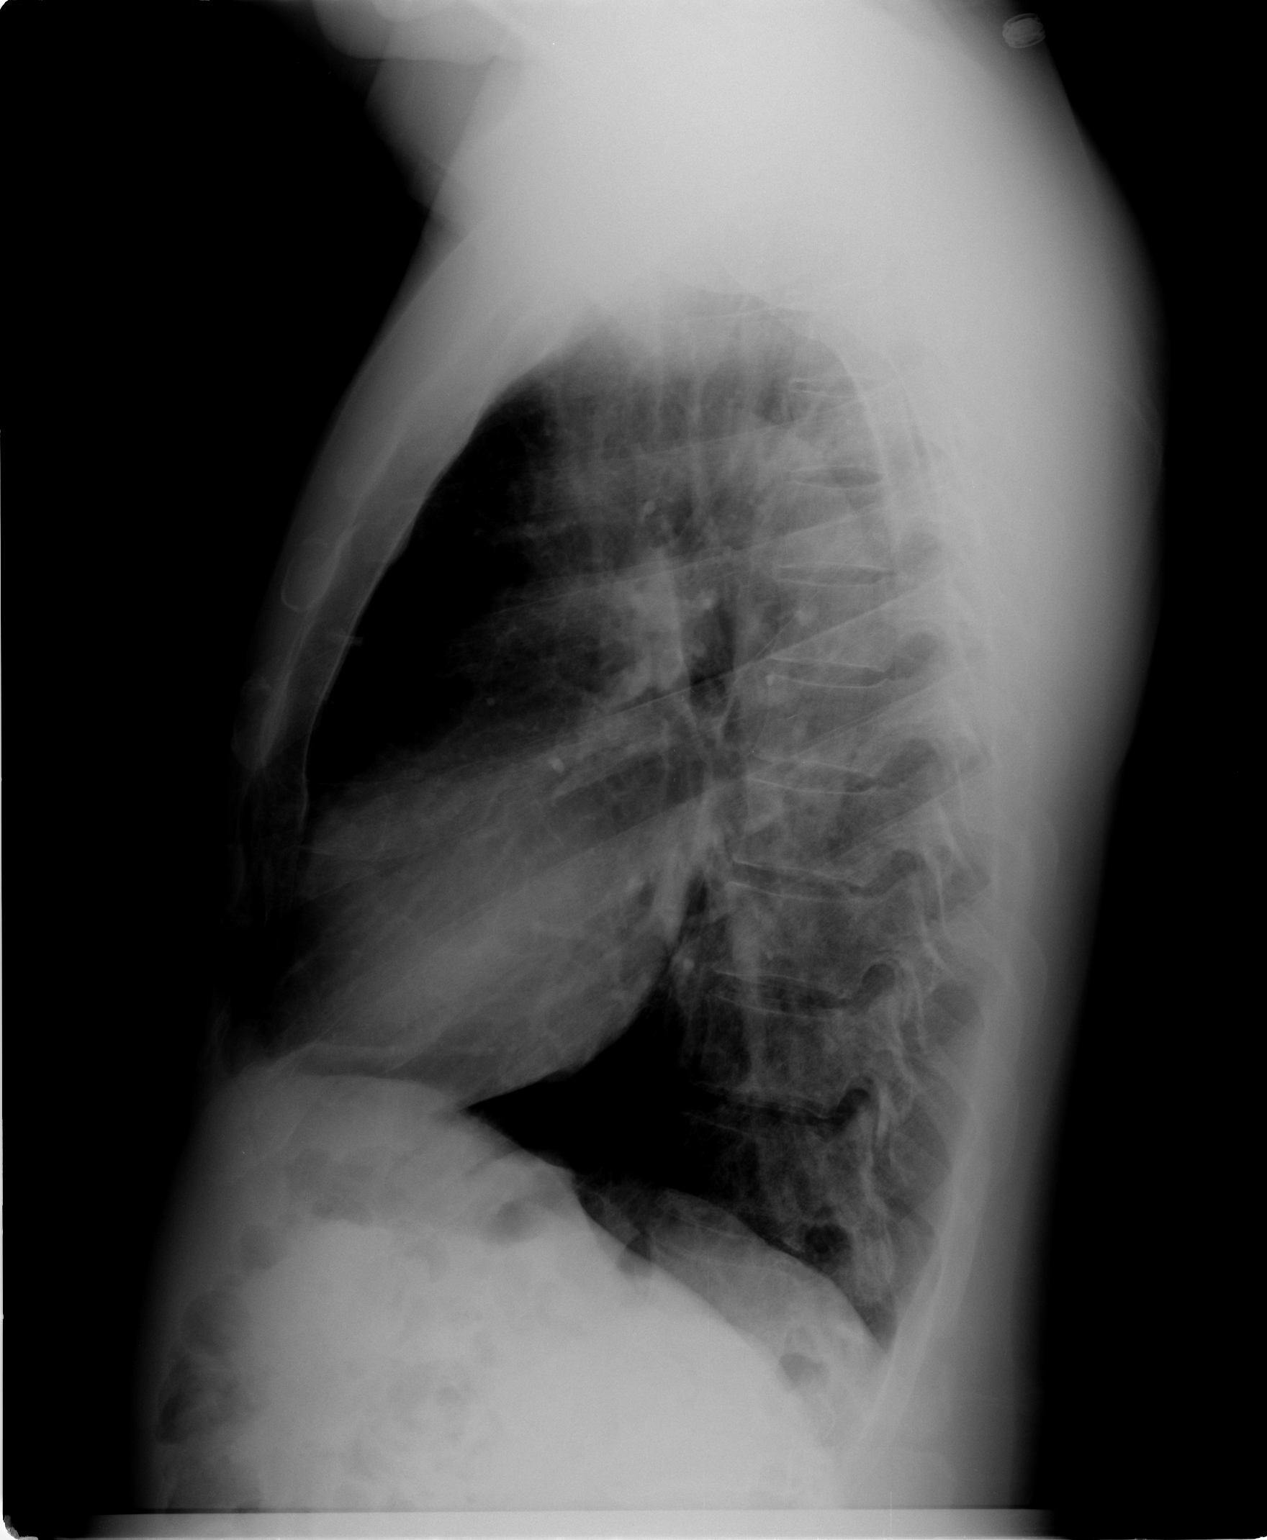

[2 of 2 positions shown; findings below may reference images not displayed]

FINDINGS: There is a 1 cm nodular density at the right lung base
which could be a nipple shadow but I cannot exclude a pulmonary
nodule.  I recommend a repeat PA view with nipple markers.

The heart size and vascularity are normal and the lungs are
otherwise clear.  No acute osseous abnormality.
IMPRESSION: Pulmonary nodule versus nipple shadow at the right lung base.  PA
view with nipple markers recommended.  Otherwise normal exam.

## 2011-12-08 ENCOUNTER — Encounter: Payer: Self-pay | Admitting: Family Medicine

## 2011-12-08 ENCOUNTER — Ambulatory Visit (INDEPENDENT_AMBULATORY_CARE_PROVIDER_SITE_OTHER): Payer: Medicaid Other | Admitting: Family Medicine

## 2011-12-08 VITALS — BP 126/75 | HR 41 | Ht 69.0 in | Wt 158.0 lb

## 2011-12-08 DIAGNOSIS — Z79899 Other long term (current) drug therapy: Secondary | ICD-10-CM

## 2011-12-08 DIAGNOSIS — R5381 Other malaise: Secondary | ICD-10-CM

## 2011-12-08 DIAGNOSIS — R5383 Other fatigue: Secondary | ICD-10-CM

## 2011-12-08 LAB — TSH: TSH: 0.87 u[IU]/mL (ref 0.350–4.500)

## 2011-12-08 NOTE — Patient Instructions (Signed)
Thank you for coming in today, it was good to see you We are checking some lab work on you today, I will let you know when that returns I don't see a reason for you to continue to use the eye patch I would like for you to return for a complete physical in the next couple of months, at your convenience.

## 2011-12-09 LAB — COMPREHENSIVE METABOLIC PANEL
Albumin: 4 g/dL (ref 3.5–5.2)
Alkaline Phosphatase: 84 U/L (ref 39–117)
BUN: 10 mg/dL (ref 6–23)
CO2: 25 mEq/L (ref 19–32)
Calcium: 9.2 mg/dL (ref 8.4–10.5)
Chloride: 107 mEq/L (ref 96–112)
Glucose, Bld: 93 mg/dL (ref 70–99)
Potassium: 4.4 mEq/L (ref 3.5–5.3)

## 2011-12-09 LAB — CBC WITH DIFFERENTIAL/PLATELET
Hemoglobin: 14.2 g/dL (ref 13.0–17.0)
Lymphocytes Relative: 42 % (ref 12–46)
Lymphs Abs: 1.9 10*3/uL (ref 0.7–4.0)
Monocytes Relative: 8 % (ref 3–12)
Neutro Abs: 2.2 10*3/uL (ref 1.7–7.7)
Neutrophils Relative %: 47 % (ref 43–77)
RBC: 4.74 MIL/uL (ref 4.22–5.81)
WBC: 4.5 10*3/uL (ref 4.0–10.5)

## 2011-12-10 ENCOUNTER — Encounter: Payer: Self-pay | Admitting: Internal Medicine

## 2011-12-10 ENCOUNTER — Ambulatory Visit (INDEPENDENT_AMBULATORY_CARE_PROVIDER_SITE_OTHER): Payer: Medicaid Other | Admitting: Internal Medicine

## 2011-12-10 VITALS — BP 117/69 | HR 44 | Ht 70.0 in | Wt 156.0 lb

## 2011-12-10 DIAGNOSIS — R001 Bradycardia, unspecified: Secondary | ICD-10-CM

## 2011-12-10 DIAGNOSIS — I498 Other specified cardiac arrhythmias: Secondary | ICD-10-CM

## 2011-12-10 NOTE — Progress Notes (Signed)
HPI Patient is a 56 year old with a history of bradycardia.  I saw him in clinic in January 2012. TSH was normal.  Holter monitor showed longest pause of 3 sec  Treamil patient increased HR to 116 bpm.  BP 214/112.   Since seen he denies dizziness, no CP.  No SOB.  No palpitations. No Known Allergies  Current Outpatient Prescriptions  Medication Sig Dispense Refill  . docusate sodium (COLACE) 100 MG capsule Take 100 mg by mouth 2 (two) times daily as needed. For constipation       . risperiDONE (RISPERDAL) 2 MG tablet Take 1.5 mg by mouth at bedtime.        . traZODone (DESYREL) 100 MG tablet Take 100 mg by mouth at bedtime.          No past medical history on file.  No past surgical history on file.  No family history on file.  History   Social History  . Marital Status: Single    Spouse Name: N/A    Number of Children: N/A  . Years of Education: N/A   Occupational History  . Not on file.   Social History Main Topics  . Smoking status: Never Smoker   . Smokeless tobacco: Not on file  . Alcohol Use: Not on file  . Drug Use: Not on file  . Sexually Active: Not on file   Other Topics Concern  . Not on file   Social History Narrative  . No narrative on file    Review of Systems:  All systems reviewed.  They are negative to the above problem except as previously stated.  Vital Signs: BP 117/69  Pulse 44  Ht 5\' 10"  (1.778 m)  Wt 156 lb (70.761 kg)  BMI 22.38 kg/m2  Physical Exam  HEENT:  Normocephalic, atraumatic. EOMI, PERRLA.  Neck: JVP is normal. No thyromegaly. No bruits.  Lungs: clear to auscultation. No rales no wheezes.  Heart: Regular rate and rhythm. Normal S1, S2. No S3.   No significant murmurs. PMI not displaced.  Abdomen:  Supple, nontender. Normal bowel sounds. No masses. No hepatomegaly.  Extremities:   Good distal pulses throughout. No lower extremity edema.  Musculoskeletal :moving all extremities.  Neuro:   alert and oriented x3.  CN II-XII  grossly intact.  EKG:  Sinus bradycardia.  44 bpm.  First degree AV block.  PR 212 msec.  LVH.   Assessment and Plan:  1.  Bradycardia.  Tolerating.  No symptoms to suggest that he has any hemodynamic instability. I encouraged him to stay active.   F/U in 1 year.

## 2011-12-12 NOTE — Assessment & Plan Note (Addendum)
Alert today.  Having stress test this Friday.  WIll check lab work, TSH to look for reasons for fatigue.

## 2011-12-12 NOTE — Progress Notes (Signed)
  Subjective:    Patient ID: Jesse Christensen, male    DOB: 09-Oct-1955, 56 y.o.   MRN: [id number]  HPI 1. Fatigue:  Feels somewhat more fatigued than usual.  Daughter states that he naps more during the day.  Patient reports for the most part he feels pretty good, maybe a little more fatigued.  He states that he is sleeping well.  He says he is getting a stress test this week for his low heart rate, referred to cards by surgeon for bradycardia.  He denies any palpitations, chest pain, skin/nail changes, changes in medication doses.     Review of Systems     Objective:   Physical Exam  Constitutional: He is oriented to person, place, and time. He appears well-developed and well-nourished. No distress.  HENT:  Head: Normocephalic and atraumatic.  Mouth/Throat: Oropharynx is clear and moist.  Neck: Neck supple. No thyromegaly present.  Cardiovascular: Regular rhythm and normal heart sounds.        Bradycardic   Pulmonary/Chest: Effort normal and breath sounds normal. He has no wheezes. He has no rales.  Abdominal: Soft. He exhibits no distension. There is no tenderness.  Musculoskeletal: Normal range of motion. He exhibits no edema.  Neurological: He is alert and oriented to person, place, and time.  Psychiatric: He has a normal mood and affect. His behavior is normal. Judgment and thought content normal.          Assessment & Plan:

## 2011-12-24 ENCOUNTER — Encounter: Payer: Self-pay | Admitting: Family Medicine

## 2012-02-28 ENCOUNTER — Encounter: Payer: Self-pay | Admitting: Family Medicine

## 2012-02-28 ENCOUNTER — Ambulatory Visit (INDEPENDENT_AMBULATORY_CARE_PROVIDER_SITE_OTHER): Payer: Medicaid Other | Admitting: Family Medicine

## 2012-02-28 VITALS — BP 126/74 | HR 80 | Ht 69.0 in | Wt 168.9 lb

## 2012-02-28 DIAGNOSIS — M7050 Other bursitis of knee, unspecified knee: Secondary | ICD-10-CM

## 2012-02-28 DIAGNOSIS — IMO0002 Reserved for concepts with insufficient information to code with codable children: Secondary | ICD-10-CM

## 2012-02-28 NOTE — Progress Notes (Signed)
  Subjective:    Patient ID: Jesse Christensen, male    DOB: 07/02/1956, 56 y.o.   MRN: [id number]  HPI 1. Fluid on knee:   Patient here because he feels like he has "fluid on his L knee"  States that he noticed a couple weeks ago that inner part of L knee had some swelling.  Moderately painful at that time, but has improved significantly since that time.  No injury to the knee.  He does walk quite a bit and thinks this is what caused this.  He denies instability, locking, giving of the knee.    Review of Systems Pe rHPI    Objective:   Physical Exam  Constitutional: He appears well-developed and well-nourished. No distress.  Musculoskeletal:       L knee with mild swelling and tenderness at area of pes anserine.  ROM is full in flexion and extension.  There is no joint line tenderness or effusion present. Ligamentous structure intact.            Assessment & Plan:

## 2012-02-28 NOTE — Assessment & Plan Note (Signed)
Swelling and mild tenderness at area of pes anserine.  He has been icing which as helped, instructed that he may continue. Ibuprofen as needed.  Will likely resolve on its own.  Instructed to return if worsening.

## 2012-02-28 NOTE — Patient Instructions (Addendum)
Bursitis Bursitis is inflammation of a bursa. A bursa is a soft, fluid-filled sac. It cushions the soft tissue around a bone. Bursitis often occurs in the bursas near the shoulders, elbows, knees, pelvis, hips, heel, and Achilles tendon.  SYMPTOMS   Pain and tenderness in the affected area. Sometimes, pain radiates into surrounding areas. Specifically, pain with movement.   Limited range of motion of the affected joint.   Sometimes, painless swelling of the bursa.   Fever (when infected).  CAUSES   Injury to a joint or bursa.   Overuse or strenuous exercise of a joint.   Gout (disease with inflamed joints).   Prolonged pressure on a joint containing bursas (resting on an elbow or kneeling).   Arthritis.   Acute or chronic infection.   Calcium deposits in shoulder tendons, with degeneration of the tendon.  RISK INCREASES WITH:  Vigorous, repeated, or sudden increase in athletic training or activity level.   Failure to warm-up properly.   Overstretching.   Improper exercise technique.   Playing sports on [employer].  PREVENTION  Avoid injuries or overuse of muscles.   Warm-up and cool down properly. Do this before and after physical activity.   Maintain proper conditioning:   Joint flexibility.   Muscle strength and endurance.   Cardiovascular fitness.   Learn and use proper technique.   Wear protective equipment.  PROGNOSIS  With proper treatment, symptoms often go away within 7 to 14 days.  RELATED COMPLICATIONS   Frequent recurrence of symptoms. This can result in a chronic, repetitive problem.   Joint stiffness.   Limited joint movement.   Infection of bursa.   Chronic inflammation or scarring of bursa.  TREATMENT Treatment first involves protecting and resting the bursa and its joint. You may use ice or an elastic bandage to reduce inflammation. Anti-inflammatory medicines may help resolve the swelling. If symptoms persist despite treatment, a  caregiver may withdraw fluid from the bursa. They might also consider a corticosteroid injection. Sometimes, bursitis will persist in spite of non-surgical treatment or will become infected. These cases may require removal (surgical excision) of the bursa.  MEDICATION   If pain medicine is needed, nonsteroidal anti-inflammatory medicines, such as aspirin and ibuprofen, or other minor pain relievers, such as acetaminophen, are often recommended.   Do not take pain medicine for 7 days before surgery.   Prescription pain relievers are usually only prescribed after surgery. Use only as directed and only as much as you need.   Ointments applied to the skin may be helpful.   Corticosteroid injections may be given. This is done to reduce inflammation in the bursa.  HEAT AND COLD:  Cold treatment (icing) relieves pain and reduces inflammation. Cold treatment should be applied for 10 to 15 minutes every 2 to 3 hours for inflammation and pain, and immediately after any activity that aggravates your symptoms. Use ice packs or an ice massage.   Heat treatment may be used prior to performing the stretching and strengthening activities prescribed by your caregiver, physical therapist, or athletic trainer. Use a heat pack or a warm soak.  SEEK MEDICAL CARE IF:   Symptoms get worse or do not improve in 2 weeks, despite treatment.   New, unexplained symptoms develop. (Drugs used in treatment may produce side effects.)  Document Released: 06/28/2005 Document Revised: 06/17/2011 Document Reviewed: 10/10/2008 [facility] Patient Information 2012 [place], [place].

## 2012-08-21 ENCOUNTER — Ambulatory Visit (INDEPENDENT_AMBULATORY_CARE_PROVIDER_SITE_OTHER): Payer: Medicaid Other | Admitting: Family Medicine

## 2012-08-21 VITALS — BP 139/75 | HR 58 | Ht 69.0 in | Wt 178.0 lb

## 2012-08-21 DIAGNOSIS — K219 Gastro-esophageal reflux disease without esophagitis: Secondary | ICD-10-CM | POA: Insufficient documentation

## 2012-08-21 DIAGNOSIS — Z79899 Other long term (current) drug therapy: Secondary | ICD-10-CM

## 2012-08-21 LAB — COMPREHENSIVE METABOLIC PANEL
ALT: 21 U/L (ref 0–53)
AST: 24 U/L (ref 0–37)
CO2: 27 mEq/L (ref 19–32)
Calcium: 8.9 mg/dL (ref 8.4–10.5)
Chloride: 109 mEq/L (ref 96–112)
Sodium: 141 mEq/L (ref 135–145)
Total Protein: 5.9 g/dL — ABNORMAL LOW (ref 6.0–8.3)

## 2012-08-21 LAB — CBC
MCHC: 34.5 g/dL (ref 30.0–36.0)
MCV: 88.1 fL (ref 78.0–100.0)
Platelets: 262 10*3/uL (ref 150–400)
RDW: 13.8 % (ref 11.5–15.5)
WBC: 5.2 10*3/uL (ref 4.0–10.5)

## 2012-08-21 NOTE — Assessment & Plan Note (Signed)
Check cbc and cmet since on atypical antipsychotic to monitor blood glucose and blood counts.  Will fax to [facility] at [phone] once results return

## 2012-08-21 NOTE — Patient Instructions (Addendum)
Thank you for coming in today, it was good to see you I am checking some lab work today that I will send over to [facility] as well.  I think your dark stool may be coming from your use of pepto bismol.  Another option for your heartburn would be zantac (ranitidine).   I would like to see you back in 6 months or sooner as needed.

## 2012-08-21 NOTE — Progress Notes (Signed)
  Subjective:    Patient ID: Jesse Christensen, male    DOB: 1956-01-18, 57 y.o.   MRN: [id number]  HPI  1. Heartburn:  Reports occasional heartburn.  Using pepto bismol to control symptoms which works well.  He does endorse having dark stools occasionally as well. He denies any undesired weight loss or abdominal pain. He has not seen any gross blood.  He had a colonoscopy in 2012 by Dr. Elnoria Howard and is due again in 2015-2017.  He does recall having upper endoscopy as well but this has been a while ago.    2. Schizophrenia:  Reports that this has been stable.  Followed by [facility] who requests some lab work for monitoring his medications.  Currently using risperidone and trazodone for sleep.   Review of Systems Per HPI    Objective:   Physical Exam  Constitutional: He appears well-nourished. No distress.  HENT:  Head: Normocephalic and atraumatic.  Abdominal: Soft. Bowel sounds are normal. He exhibits no distension. There is no tenderness. There is no guarding.  Genitourinary: Rectum normal. Guaiac negative stool.  Psychiatric:  Odd behavior.  But overall normal affect.           Assessment & Plan:

## 2012-08-21 NOTE — Assessment & Plan Note (Signed)
Occasional reflux symptoms controlled by pepto bismol.  Reports occasional dark stool.  Hemoccult negative.  Dark stools may be coming from pepto use.  Advised trying H2 blocker instead.  Return if not improving.

## 2012-12-07 ENCOUNTER — Ambulatory Visit (INDEPENDENT_AMBULATORY_CARE_PROVIDER_SITE_OTHER): Payer: Medicaid Other | Admitting: Family Medicine

## 2012-12-07 ENCOUNTER — Encounter: Payer: Self-pay | Admitting: Family Medicine

## 2012-12-07 VITALS — BP 140/72 | HR 60 | Ht 69.0 in | Wt 165.5 lb

## 2012-12-07 DIAGNOSIS — M1710 Unilateral primary osteoarthritis, unspecified knee: Secondary | ICD-10-CM

## 2012-12-07 DIAGNOSIS — M25519 Pain in unspecified shoulder: Secondary | ICD-10-CM | POA: Insufficient documentation

## 2012-12-07 DIAGNOSIS — M25569 Pain in unspecified knee: Secondary | ICD-10-CM

## 2012-12-07 DIAGNOSIS — M170 Bilateral primary osteoarthritis of knee: Secondary | ICD-10-CM

## 2012-12-07 DIAGNOSIS — M25562 Pain in left knee: Secondary | ICD-10-CM | POA: Insufficient documentation

## 2012-12-07 DIAGNOSIS — IMO0002 Reserved for concepts with insufficient information to code with codable children: Secondary | ICD-10-CM

## 2012-12-07 DIAGNOSIS — M25561 Pain in right knee: Secondary | ICD-10-CM

## 2012-12-07 MED ORDER — MELOXICAM 15 MG PO TABS: 15.0000 mg | ORAL_TABLET | Freq: Every day | ORAL | Status: DC

## 2012-12-07 NOTE — Progress Notes (Signed)
  Subjective:    Patient ID: Jesse Christensen, male    DOB: 1956/05/19, 57 y.o.   MRN: [id number]  Shoulder Pain     1. Shoulder pain: Intermittent pain along collar bone for the past month.  Pain usually lasts only a few minutes after doing push ups.  He does not have pain currently. Denies swelling, decreased ROM.  2. Knee pain: Bilateral knee pain off and on for the past month, L >R.  He does endorse some mild swelling of the L knee along the medial aspect.  Pain is worse after walking long distances.  Has been icing which helps tremendously.   Review of Systems Per HPI    Objective:   Physical Exam  Constitutional: He appears well-nourished. No distress.  HENT:  Head: Normocephalic and atraumatic.  Musculoskeletal:  Mild swelling around pes anserine on L.  Joint ROM is full and without pain.  Negative mcmurray and thessaly test. Patellar compression/grind negative.     Shoulder with FROM without tenderness to palpation along AC joint.           Assessment & Plan:

## 2012-12-07 NOTE — Patient Instructions (Signed)
Thank you for coming in today, it was good to see you I am going to refer you to physical therapy Start taking the meloxicam with food each morning.   Continue to ice especially after activity.   Follow up in 4-6 weeks.

## 2012-12-07 NOTE — Assessment & Plan Note (Signed)
Mild swelling along pes anserine, has  Had pes anserine bursitis in the past.  Can continue conservative therapy with RICE and nsaid.  Will also refer to PT.  Follow up in 4-6 weeks.

## 2012-12-07 NOTE — Assessment & Plan Note (Signed)
NO pain today and normal exam.  Unsure of etiology.

## 2012-12-22 ENCOUNTER — Ambulatory Visit: Payer: Medicaid Other | Admitting: Physical Therapy

## 2013-01-05 ENCOUNTER — Ambulatory Visit: Payer: Medicaid Other | Admitting: Family Medicine

## 2013-01-19 ENCOUNTER — Encounter: Payer: Self-pay | Admitting: Family Medicine

## 2013-01-19 ENCOUNTER — Ambulatory Visit (INDEPENDENT_AMBULATORY_CARE_PROVIDER_SITE_OTHER): Payer: Medicaid Other | Admitting: Family Medicine

## 2013-01-19 VITALS — BP 118/63 | HR 60 | Ht 69.0 in | Wt 165.7 lb

## 2013-01-19 DIAGNOSIS — M25562 Pain in left knee: Secondary | ICD-10-CM

## 2013-01-19 DIAGNOSIS — F209 Schizophrenia, unspecified: Secondary | ICD-10-CM

## 2013-01-19 DIAGNOSIS — M25561 Pain in right knee: Secondary | ICD-10-CM

## 2013-01-19 DIAGNOSIS — M25569 Pain in unspecified knee: Secondary | ICD-10-CM

## 2013-01-19 DIAGNOSIS — I498 Other specified cardiac arrhythmias: Secondary | ICD-10-CM

## 2013-01-19 NOTE — Assessment & Plan Note (Addendum)
No complaints or concerns. Followed by Jesse Christensen every 3 months. Continues to take Risperidone, Trazodone. Consider requesting records for more information.

## 2013-01-19 NOTE — Assessment & Plan Note (Addendum)
Significantly improved knee pain (L > R). No edema or erythema. Knees stable bilaterally, gait normal. Patient had been taking Mobic 15mg  daily x1 month. Also, admits heat/ice do help, does daily stretching / home exercises. Attempted to go to PT (unable to complete due to medicaid ins.). Has 1 more refill of Mobic. Advised to taper off and stop if no longer needed. May use Tylenol if pain is minor. Return if worsens.

## 2013-01-19 NOTE — Patient Instructions (Addendum)
Thanks for coming in to see me. It was great to meet you today!  Today we discussed several things. 1. I'm glad to hear that your Left Knee pain has improved. Continue to take the Mobic (anti-inflammatory) pill if you still need it for pain. Otherwise, I'd like to you stop taking it. Continue using heat/ice as needed for relief. Daily stretching and exercises are always helpful to prevent problems. Walking will help as well. 2. Be aware of your slow heart rate. Go to your follow-up appointment with Cardiology and let me know what they say next time you come back. If you develop new symptoms such as lightheaded, dizzy, or even pass out, it is important to seek care.  Please call to schedule a new appointment as needed. Otherwise, return in 6 months for follow-up.  If you have any other questions or concerns, please feel free to call the clinic to contact me. You may also schedule another appointment if necessary.  However, if your symptoms get significantly worse, please go to the Emergency Department to seek immediate medical attention.  Saralyn Pilar, DO [facility]

## 2013-01-19 NOTE — Progress Notes (Signed)
Subjective:     Patient ID: Jesse Christensen, male   DOB: February 10, 1956, 57 y.o.   MRN: [id number]  HPI  Patient attended by medical aid at visit, history obtained from both.  KNEE PAIN, Bilateral (L>R) On follow-up, patient reports that he no longer has pain in his knees. Previously, described some pain and swelling mostly in his Left Knee, and was last seen 11/2012. He took Mobic 15mg  daily for pain with significant relief. Also improved by daily stretching, exercises (straight leg raises), walking. Denies fever/chills, joint pain, edema, instability, trauma/injury.  Hx BRADYCARDIA Today HR 60. Past hx >37yr ago of HR 30s-40s. Followed by Dr. [facility]) with upcoming apt 03/02/13. Reported that was told may need a pacemaker in 5+ years if heart rate continues to slow or develops symptoms. Denies fatigue, chest pain, shortness of breath, lightheadedness, dizziness, pre-syncope, or syncopal episodes.   SCHIZOPHRENIA No complaints or concerns today. Followed by Vesta Mixer every 3 months. Currently taking Risperidone, Trazodone.  Health maintenance - Colonoscopy last performed 03/2011 Social Hx - Never smoker  Review of Systems  See above HPI, all other systems negative.     Objective:   Physical Exam  BP 118/63  Pulse 60  Ht 5\' 9"  (1.753 m)  Wt 165 lb 11.2 oz (75.161 kg)  BMI 24.46 kg/m2  General - pleasant, WDWN, NAD HEENT - PERRLA Neck - supple, non-tender Heart - Regular rate and rhythm.  No murmurs, gallops or rubs.    Lungs:  Normal respiratory effort, chest expands symmetrically. Lungs are clear to auscultation, no crackles or wheezes. Extremities:  Left knee appears normal, without edema, erythema. Non-tender to palpation. No edema or deformity noted with good range of motion of all major joints. Neuro - Grossly intact, no focal deficits, muscle str 5/5 in bilateral lower extremities Psych - alert, oriented, appropriate affect, good eye contact

## 2013-01-19 NOTE — Assessment & Plan Note (Addendum)
Hx of Bradycardia (30s-40s). Past year HR has been stable >60. Today HR is 60. Denies any fatigue, lightheadedness, dizziness, pre-syncopal/syncopal episodes. No complaints related to HR. Scheduled for Cardiology apt at [facility] (03/02/13) for this issue. Previously told he may require a pace-maker 5+ years from now if worsened.

## 2013-03-01 ENCOUNTER — Encounter: Payer: Self-pay | Admitting: *Deleted

## 2013-03-02 ENCOUNTER — Ambulatory Visit (INDEPENDENT_AMBULATORY_CARE_PROVIDER_SITE_OTHER): Payer: Medicaid Other | Admitting: Internal Medicine

## 2013-03-02 ENCOUNTER — Encounter: Payer: Self-pay | Admitting: Internal Medicine

## 2013-03-02 VITALS — BP 122/86 | HR 33 | Ht 69.0 in | Wt 159.4 lb

## 2013-03-02 DIAGNOSIS — I498 Other specified cardiac arrhythmias: Secondary | ICD-10-CM

## 2013-03-02 LAB — TSH: TSH: 1.24 u[IU]/mL (ref 0.35–5.50)

## 2013-03-02 LAB — BASIC METABOLIC PANEL
BUN: 11 mg/dL (ref 6–23)
Chloride: 105 mEq/L (ref 96–112)
Creatinine, Ser: 1.1 mg/dL (ref 0.4–1.5)
GFR: 89.3 mL/min (ref >60.00)
Potassium: 3.9 mEq/L (ref 3.5–5.1)

## 2013-03-02 NOTE — Patient Instructions (Addendum)
Your physician recommends that you continue on your current medications as directed. Please refer to the Current Medication list given to you today.  Your physician recommends that you return for lab work in: today  Your physician has recommended that you wear a holter monitor. Holter monitors are medical devices that record the heart's electrical activity. Doctors most often use these monitors to diagnose arrhythmias. Arrhythmias are problems with the speed or rhythm of the heartbeat. The monitor is a small, portable device. You can wear one while you do your normal daily activities. This is usually used to diagnose what is causing palpitations/syncope (passing out).  Your physician wants you to follow-up in: 6 months. You will receive a reminder letter in the mail two months in advance. If you don't receive a letter, please call our office to schedule the follow-up appointment.

## 2013-03-02 NOTE — Progress Notes (Signed)
HPI HPI Patient is a 57 year old with a history of bradycardia. I saw him in clinic in January 2012.  TSH was normal. Holter monitor showed longest pause of 3 sec Treamill patient increased HR to 116 bpm. Since seen he says he has been feeling good  Breathing OK  No SOB  No CP  No dizziness.    No Known Allergies  Current Outpatient Prescriptions  Medication Sig Dispense Refill  . docusate sodium (COLACE) 100 MG capsule Take 100 mg by mouth 2 (two) times daily as needed. For constipation       . meloxicam (MOBIC) 15 MG tablet Take 1 tablet (15 mg total) by mouth daily.  30 tablet  1  . risperiDONE (RISPERDAL) 2 MG tablet Take 1.5 mg by mouth at bedtime.        . traZODone (DESYREL) 100 MG tablet Take 100 mg by mouth at bedtime.         No current facility-administered medications for this visit.    Past Medical History  Diagnosis Date  . SCHIZOPHRENIA   . INSOMNIA-SLEEP DISORDER-UNSPEC   . BRADYCARDIA   . OSTEOARTHRITIS, KNEE   . Edema   . Encounter for long-term (current) use of medications   . Fatigue   . GERD (gastroesophageal reflux disease)   . Knee pain, bilateral   . Pain in joint, shoulder region     No past surgical history on file.  No family history on file.  History   Social History  . Marital Status: Single    Spouse Name: N/A    Number of Children: N/A  . Years of Education: N/A   Occupational History  . Not on file.   Social History Main Topics  . Smoking status: Never Smoker   . Smokeless tobacco: Not on file  . Alcohol Use: Not on file  . Drug Use: Not on file  . Sexual Activity: Not on file   Other Topics Concern  . Not on file   Social History Narrative  . No narrative on file    Review of Systems:  All systems reviewed.  They are negative to the above problem except as previously stated.  Vital Signs: BP 122/86  Pulse 33  Ht 5\' 9"  (1.753 m)  Wt 159 lb 6.4 oz (72.303 kg)  BMI 23.53 kg/m2  Physical Exam  HEENT:  Normocephalic,  atraumatic. EOMI, PERRLA.  Neck: JVP is normal.  No bruits.  Lungs: clear to auscultation. No rales no wheezes.  Heart: Regular rate and rhythm. Normal S1, S2. No S3.   No significant murmurs. PMI not displaced.  Abdomen:  Supple, nontender. Normal bowel sounds. No masses. No hepatomegaly.  Extremities:   Good distal pulses throughout. No lower extremity edema.  Musculoskeletal :moving all extremities.  Neuro:   alert and oriented x3.  CN II-XII grossly intact.   Assessment and Plan:    No Known Allergies  Current Outpatient Prescriptions  Medication Sig Dispense Refill  . docusate sodium (COLACE) 100 MG capsule Take 100 mg by mouth 2 (two) times daily as needed. For constipation       . meloxicam (MOBIC) 15 MG tablet Take 1 tablet (15 mg total) by mouth daily.  30 tablet  1  . risperiDONE (RISPERDAL) 2 MG tablet Take 1.5 mg by mouth at bedtime.        . traZODone (DESYREL) 100 MG tablet Take 100 mg by mouth at bedtime.  No current facility-administered medications for this visit.    Past Medical History  Diagnosis Date  . SCHIZOPHRENIA   . INSOMNIA-SLEEP DISORDER-UNSPEC   . BRADYCARDIA   . OSTEOARTHRITIS, KNEE   . Edema   . Encounter for long-term (current) use of medications   . Fatigue   . GERD (gastroesophageal reflux disease)   . Knee pain, bilateral   . Pain in joint, shoulder region     No past surgical history on file.  No family history on file.  History   Social History  . Marital Status: Single    Spouse Name: N/A    Number of Children: N/A  . Years of Education: N/A   Occupational History  . Not on file.   Social History Main Topics  . Smoking status: Never Smoker   . Smokeless tobacco: Not on file  . Alcohol Use: Not on file  . Drug Use: Not on file  . Sexual Activity: Not on file   Other Topics Concern  . Not on file   Social History Narrative  . No narrative on file    Review of Systems:  All systems reviewed.  They are  negative to the above problem except as previously stated.  Vital Signs: BP 122/86  Pulse 33  Ht 5\' 9"  (1.753 m)  Wt 159 lb 6.4 oz (72.303 kg)  BMI 23.53 kg/m2  Physical Exam Patient is in NAD HEENT:  Normocephalic, atraumatic. EOMI, PERRLA.  Neck: JVP is normal.  No bruits.  Lungs: clear to auscultation. No rales no wheezes.  Heart: Regular rate and rhythm. Normal S1, S2. No S3.   No significant murmurs. PMI not displaced.  Abdomen:  Supple, nontender. Normal bowel sounds. No masses. No hepatomegaly.  Extremities:   Good distal pulses throughout. No lower extremity edema.  Musculoskeletal :moving all extremities.  Neuro:   alert and oriented x3.  CN II-XII grossly intact.  EKG  Ectopic atrial rhythm  33 bpm.  First degree AV block (pr interval ).  QRS 98 msec Assessment and Plan:  1.  Bradycardia  Patinet asymptomatic  With walking HR increased to 61  Will check BMET and TSH Set up for holter Patient instructed on warning symptoms (dizziness, palpitations, CP, SOB)  Call sooner  Otherwise if Holter shows no sigif pauses plan f/u in 6 months.

## 2013-03-06 ENCOUNTER — Encounter (INDEPENDENT_AMBULATORY_CARE_PROVIDER_SITE_OTHER): Payer: Medicaid Other

## 2013-03-06 ENCOUNTER — Encounter: Payer: Self-pay | Admitting: *Deleted

## 2013-03-06 DIAGNOSIS — I498 Other specified cardiac arrhythmias: Secondary | ICD-10-CM

## 2013-03-06 DIAGNOSIS — I440 Atrioventricular block, first degree: Secondary | ICD-10-CM

## 2013-03-06 NOTE — Progress Notes (Signed)
Patient ID: Jesse Christensen, male   DOB: [birth year], 57 y.o.   MRN: [id number] E[facility] 48 Hour Holter Monitor applied to patient.

## 2013-03-16 ENCOUNTER — Telehealth: Payer: Self-pay | Admitting: *Deleted

## 2013-03-16 NOTE — Telephone Encounter (Signed)
Message copied by Burnell Blanks on Fri Mar 16, 2013  2:04 PM ------      Message from: Dietrich Pates V      Created: Mon Mar 05, 2013 12:27 AM       Thyroid and electrolytes are normal.      Patient was scheduled to be set up for holter monitor. ------

## 2013-03-16 NOTE — Telephone Encounter (Signed)
Mailed copy of labs and left message to call if any questions  

## 2013-03-20 ENCOUNTER — Telehealth: Payer: Self-pay | Admitting: *Deleted

## 2013-03-20 NOTE — Telephone Encounter (Signed)
Follow Up    Calling returning call from earlier.

## 2013-03-20 NOTE — Telephone Encounter (Signed)
lmtcb ./cy 

## 2013-03-20 NOTE — Telephone Encounter (Signed)
PT'S WIFE AWARE   APPT MADE FOR  03-26-13 AT  12:30 PM  PER  WIFE  PT HAS  BEEN SYMPTOMATIC  X1    PRIOR  TO APPT FEELS FINE RIGHT NOW .Zack Seal

## 2013-03-20 NOTE — Telephone Encounter (Signed)
LM FOR PT  TO CALL BACK RE  MONITOR RESULTS PER DR ROSS  OCC PAC'S RARE PVC'S RATE   22 -125 WITH AVERAGE OF  47 NEEDS APPT  FOR  MON  03-26-13 PER DR ROSS .Jesse Christensen

## 2013-03-26 ENCOUNTER — Ambulatory Visit (INDEPENDENT_AMBULATORY_CARE_PROVIDER_SITE_OTHER): Payer: Medicaid Other | Admitting: Internal Medicine

## 2013-03-26 ENCOUNTER — Encounter: Payer: Self-pay | Admitting: Internal Medicine

## 2013-03-26 VITALS — BP 114/63 | HR 39 | Ht 69.0 in | Wt 164.0 lb

## 2013-03-26 DIAGNOSIS — R001 Bradycardia, unspecified: Secondary | ICD-10-CM

## 2013-03-26 DIAGNOSIS — I498 Other specified cardiac arrhythmias: Secondary | ICD-10-CM

## 2013-03-26 NOTE — Patient Instructions (Signed)
Your physician recommends that you continue on your current medications as directed. Please refer to the Current Medication list given to you today.  Your physician wants you to follow-up in: Feb 2015 You will receive a reminder letter in the mail two months in advance. If you don't receive a letter, please call our office to schedule the follow-up appointment.

## 2013-03-26 NOTE — Progress Notes (Signed)
HPI HPI Patient is a 57 year old with a history of bradycardia.  I saw him in clnic about 1 month ago.  His EKG showed SB at 33 bpm.  Denied SOB or CP I set him up for 48 hour Holter.  HR ranged from 20 to 125 bpm.  Average HR around 45 bpm.  Longest pause was 4.3 sec at 5:20 AM Since seen he says he has been feeling good  Breathing OK  No SOB  No CP  No dizziness.    No Known Allergies  Current Outpatient Prescriptions  Medication Sig Dispense Refill  . docusate sodium (COLACE) 100 MG capsule Take 100 mg by mouth 2 (two) times daily as needed. For constipation       . meloxicam (MOBIC) 15 MG tablet Take 1 tablet (15 mg total) by mouth daily.  30 tablet  1  . risperiDONE (RISPERDAL) 2 MG tablet Take 1.5 mg by mouth at bedtime.        . traZODone (DESYREL) 100 MG tablet Take 100 mg by mouth at bedtime.         No current facility-administered medications for this visit.    Past Medical History  Diagnosis Date  . SCHIZOPHRENIA   . INSOMNIA-SLEEP DISORDER-UNSPEC   . BRADYCARDIA   . OSTEOARTHRITIS, KNEE   . Edema   . Encounter for long-term (current) use of medications   . Fatigue   . GERD (gastroesophageal reflux disease)   . Knee pain, bilateral   . Pain in joint, shoulder region     No past surgical history on file.  No family history on file.  History   Social History  . Marital Status: Single    Spouse Name: N/A    Number of Children: N/A  . Years of Education: N/A   Occupational History  . Not on file.   Social History Main Topics  . Smoking status: Never Smoker   . Smokeless tobacco: Not on file  . Alcohol Use: Not on file  . Drug Use: Not on file  . Sexual Activity: Not on file   Other Topics Concern  . Not on file   Social History Narrative  . No narrative on file    Review of Systems:  All systems reviewed.  They are negative to the above problem except as previously stated.  Vital Signs: BP 114/63  Pulse 39  Ht 5\' 9"  (1.753 m)  Wt 164 lb  (74.39 kg)  BMI 24.21 kg/m2  Physical Exam  HEENT:  Normocephalic, atraumatic. EOMI, PERRLA.  Neck: JVP is normal.  No bruits.  Lungs: clear to auscultation. No rales no wheezes.  Heart: Regular rate and rhythm. Normal S1, S2. No S3.   No significant murmurs. PMI not displaced.  Abdomen:  Supple, nontender. Normal bowel sounds. No masses. No hepatomegaly.  Extremities:   Good distal pulses throughout. No lower extremity edema.  Musculoskeletal :moving all extremities.  Neuro:   alert and oriented x3.  CN II-XII grossly intact.   Assessment and Plan:    No Known Allergies  Current Outpatient Prescriptions  Medication Sig Dispense Refill  . docusate sodium (COLACE) 100 MG capsule Take 100 mg by mouth 2 (two) times daily as needed. For constipation       . meloxicam (MOBIC) 15 MG tablet Take 1 tablet (15 mg total) by mouth daily.  30 tablet  1  . risperiDONE (RISPERDAL) 2 MG tablet Take 1.5 mg by mouth at bedtime.        Marland Kitchen  traZODone (DESYREL) 100 MG tablet Take 100 mg by mouth at bedtime.         No current facility-administered medications for this visit.    Past Medical History  Diagnosis Date  . SCHIZOPHRENIA   . INSOMNIA-SLEEP DISORDER-UNSPEC   . BRADYCARDIA   . OSTEOARTHRITIS, KNEE   . Edema   . Encounter for long-term (current) use of medications   . Fatigue   . GERD (gastroesophageal reflux disease)   . Knee pain, bilateral   . Pain in joint, shoulder region     No past surgical history on file.  No family history on file.  History   Social History  . Marital Status: Single    Spouse Name: N/A    Number of Children: N/A  . Years of Education: N/A   Occupational History  . Not on file.   Social History Main Topics  . Smoking status: Never Smoker   . Smokeless tobacco: Not on file  . Alcohol Use: Not on file  . Drug Use: Not on file  . Sexual Activity: Not on file   Other Topics Concern  . Not on file   Social History Narrative  . No narrative  on file    Review of Systems:  All systems reviewed.  They are negative to the above problem except as previously stated.  Vital Signs: BP 114/63  Pulse 39  Ht 5\' 9"  (1.753 m)  Wt 164 lb (74.39 kg)  BMI 24.21 kg/m2  Physical Exam Patient is in NAD HEENT:  Normocephalic, atraumatic. EOMI, PERRLA.  Neck: JVP is normal.  No bruits.  Lungs: clear to auscultation. No rales no wheezes.  Heart: Regular rate and rhythm. Normal S1, S2. No S3.   No significant murmurs. PMI not displaced.  Abdomen:  Supple, nontender. Normal bowel sounds. No masses. No hepatomegaly.  Extremities:   Good distal pulses throughout. No lower extremity edema.  Musculoskeletal :moving all extremities.  Neuro:   alert and oriented x3.  CN II-XII grossly intact.  EKG  SB with occasional PAC  39 bpm.  First degree AV block (pr interval ).  QRS 98 msec Assessment and Plan:  1.  Bradycardia  Patient asymptomatic I discussed holter results. I have also reviewed with Rosette Reveal  WOuld reocmm following clinically  With no symptoms and holter findings no definite indication for pacer Rx now.    Patient instructed on warning symptoms (dizziness, palpitations, CP, SOB)  Call sooner

## 2013-08-20 ENCOUNTER — Ambulatory Visit (INDEPENDENT_AMBULATORY_CARE_PROVIDER_SITE_OTHER): Payer: Medicaid Other | Admitting: Family Medicine

## 2013-08-20 ENCOUNTER — Encounter: Payer: Self-pay | Admitting: Family Medicine

## 2013-08-20 VITALS — BP 124/71 | HR 39 | Temp 98.5°F | Wt 168.0 lb

## 2013-08-20 DIAGNOSIS — D1779 Benign lipomatous neoplasm of other sites: Secondary | ICD-10-CM

## 2013-08-20 DIAGNOSIS — D171 Benign lipomatous neoplasm of skin and subcutaneous tissue of trunk: Secondary | ICD-10-CM

## 2013-08-20 NOTE — Progress Notes (Signed)
Subjective:     Patient ID: Jesse Christensen, male   DOB: August 15, 1955, 58 y.o.   MRN: [id number]  HPI  CYST, RIGHT BACK: Reports cyst on Right-shoulder region, estimates that he initially first noticed >3 years ago as small lump, gradually increased in size, significantly increased within 1 year. States that last time he was at Cardiologist office, they commented on the size of the growth. - Denies any associated symptoms, not painful, no itching, no redness, drainage  PMH: Bradycardia (asymptomatic) - Followed by Cardiology (next f/u apt 09/06/13) - last seen 03/2013 for Holter monitor review without definite indication for pacemaker.  Social Hx: Never smoker  Review of Systems  See above HPI     Objective:   Physical Exam  BP 124/71  Pulse 39  Temp(Src) 98.5 F (36.9 C) (Oral)  Wt 168 lb (76.204 kg)  Gen - pleasant, well-appearing, NAD Neck - supple, non-tender, no LAD Heart - bradycardic, regular rhythm, no murmurs heard Lungs - CTAB, no wheezing, crackles, or rhonchi. Normal work of breathing. MSK - Back: large (approx 4x6x2 cm) subcutaneous mass c/w lipoma over R-scapula, soft with some firmness, mobile, no erythema / warmth / or tenderness to palpation. No surrounding skin changes. No other lesions noted Skin - warm, dry, no rashes Neuro - awake, alert, oriented     Assessment:     See specific A&P problem list for details.      Plan:     See specific A&P problem list for details.

## 2013-08-20 NOTE — Patient Instructions (Signed)
Dear Jesse Christensen, Thank you for coming in to clinic today. It was good to see you today!  Today we discussed the growth on your Right Back. 1. It looks like the spot on your back is a benign growth called a "Lipoma", a fatty tissue growth. 2. There is no medical reason to remove it if it is not bothering you. 3. If it begins to cause you any problems, please don't hesitate to call back our clinic and we can arrange for you to either have it removed in our office, or we can refer you to a Surgery office to have it removed.  Please schedule a follow-up appointment as needed for worsening symptoms.  If you have any other questions or concerns, please feel free to call the clinic to contact me. You may also schedule an earlier appointment if necessary.  However, if your symptoms get significantly worse, please go to the Emergency Department to seek immediate medical attention.  Nobie Putnam, DO [facility]   Lipoma A lipoma is a noncancerous (benign) tumor composed of fat cells. They are usually found under the skin (subcutaneous). A lipoma may occur in any tissue of the body that contains fat. Common areas for lipomas to appear include the back, shoulders, buttocks, and thighs. Lipomas are a very common soft tissue growth. They are soft and grow slowly. Most problems caused by a lipoma depend on where it is growing. DIAGNOSIS  A lipoma can be diagnosed with a physical exam. These tumors rarely become cancerous, but radiographic studies can help determine this for certain. Studies used may include:  Computerized X-ray scans (CT or CAT scan).  Computerized magnetic scans (MRI). TREATMENT  Small lipomas that are not causing problems may be watched. If a lipoma continues to enlarge or causes problems, removal is often the best treatment. Lipomas can also be removed to improve appearance. Surgery is done to remove the fatty cells and the surrounding capsule. Most  often, this is done with medicine that numbs the area (local anesthetic). The removed tissue is examined under a microscope to make sure it is not cancerous. Keep all follow-up appointments with your caregiver. SEEK MEDICAL CARE IF:   The lipoma becomes larger or hard.  The lipoma becomes painful, red, or increasingly swollen. These could be signs of infection or a more serious condition. Document Released: 06/18/2002 Document Revised: 09/20/2011 Document Reviewed: 11/28/2009 [facility] Patient Information 2014 [place], [place].

## 2013-08-20 NOTE — Assessment & Plan Note (Signed)
Exam c/w benign lipoma, no medical indication for removal. Currently asymptomatic and not bothering patient. Unlikely sebaceous cyst vs abscess (no signs of infection).  Plan: 1. Observation, no intervention 2. Discussed possibility of surgical removal. Patient not interested at this time. 3. If patient interested in removal, would need to re-evaluate prior to removing, suspect would be possible to do at [facility] procedure clinic vs referral to Gen Surgery if any further growth. 4. RTC PRN

## 2013-09-06 ENCOUNTER — Ambulatory Visit: Payer: Medicaid Other | Admitting: Internal Medicine

## 2013-09-24 ENCOUNTER — Encounter: Payer: Self-pay | Admitting: Internal Medicine

## 2013-09-24 ENCOUNTER — Ambulatory Visit (INDEPENDENT_AMBULATORY_CARE_PROVIDER_SITE_OTHER): Payer: Medicaid Other | Admitting: Internal Medicine

## 2013-09-24 VITALS — BP 132/73 | HR 34 | Ht 69.0 in | Wt 171.0 lb

## 2013-09-24 DIAGNOSIS — R001 Bradycardia, unspecified: Secondary | ICD-10-CM

## 2013-09-24 DIAGNOSIS — I498 Other specified cardiac arrhythmias: Secondary | ICD-10-CM

## 2013-09-24 NOTE — Progress Notes (Signed)
HPI HPI Patient is a 58 year old with a history of bradycardia. I set him up for 48 hour Holter last year.  HR ranged from 20 to 125 bpm.  Average HR around 45 bpm.  Longest pause was 4.3 sec at 5:20 AM Reviewed with EP   Since seen he says he has been feeling good  Breathing OK  No SOB  No CP  No dizziness.    No Known Allergies  Current Outpatient Prescriptions  Medication Sig Dispense Refill  . docusate sodium (COLACE) 100 MG capsule Take 100 mg by mouth 2 (two) times daily as needed. For constipation       . meloxicam (MOBIC) 15 MG tablet Take 1 tablet (15 mg total) by mouth daily.  30 tablet  1  . risperiDONE (RISPERDAL) 2 MG tablet Take 1.5 mg by mouth at bedtime.        . traZODone (DESYREL) 100 MG tablet Take 100 mg by mouth at bedtime.         No current facility-administered medications for this visit.    Past Medical History  Diagnosis Date  . SCHIZOPHRENIA   . INSOMNIA-SLEEP DISORDER-UNSPEC   . BRADYCARDIA   . OSTEOARTHRITIS, KNEE   . Edema   . Encounter for long-term (current) use of medications   . Fatigue   . GERD (gastroesophageal reflux disease)   . Knee pain, bilateral   . Pain in joint, shoulder region     History reviewed. No pertinent past surgical history.  No family history on file.  History   Social History  . Marital Status: Single    Spouse Name: N/A    Number of Children: N/A  . Years of Education: N/A   Occupational History  . Not on file.   Social History Main Topics  . Smoking status: Never Smoker   . Smokeless tobacco: Not on file  . Alcohol Use: Not on file  . Drug Use: Not on file  . Sexual Activity: Not on file   Other Topics Concern  . Not on file   Social History Narrative  . No narrative on file    Review of Systems:  All systems reviewed.  They are negative to the above problem except as previously stated.  Vital Signs: BP 132/73  Pulse 34  Ht 5\' 9"  (1.753 m)  Wt 171 lb (77.565 kg)  BMI 25.24 kg/m2  Physical  Exam  HEENT:  Normocephalic, atraumatic. EOMI, PERRLA.  Neck: JVP is normal.  No bruits.  Lungs: clear to auscultation. No rales no wheezes.  Heart: Regular rate and rhythm. Normal S1, S2. No S3.   No significant murmurs. PMI not displaced.  Abdomen:  Supple, nontender. Normal bowel sounds. No masses. No hepatomegaly.  Extremities:   Good distal pulses throughout. No lower extremity edema.  Musculoskeletal :moving all extremities.  Neuro:   alert and oriented x3.  CN II-XII grossly intact.   Assessment and Plan:    No Known Allergies  Current Outpatient Prescriptions  Medication Sig Dispense Refill  . docusate sodium (COLACE) 100 MG capsule Take 100 mg by mouth 2 (two) times daily as needed. For constipation       . meloxicam (MOBIC) 15 MG tablet Take 1 tablet (15 mg total) by mouth daily.  30 tablet  1  . risperiDONE (RISPERDAL) 2 MG tablet Take 1.5 mg by mouth at bedtime.        . traZODone (DESYREL) 100 MG tablet Take 100 mg by mouth  at bedtime.         No current facility-administered medications for this visit.    Past Medical History  Diagnosis Date  . SCHIZOPHRENIA   . INSOMNIA-SLEEP DISORDER-UNSPEC   . BRADYCARDIA   . OSTEOARTHRITIS, KNEE   . Edema   . Encounter for long-term (current) use of medications   . Fatigue   . GERD (gastroesophageal reflux disease)   . Knee pain, bilateral   . Pain in joint, shoulder region     History reviewed. No pertinent past surgical history.  No family history on file.  History   Social History  . Marital Status: Single    Spouse Name: N/A    Number of Children: N/A  . Years of Education: N/A   Occupational History  . Not on file.   Social History Main Topics  . Smoking status: Never Smoker   . Smokeless tobacco: Not on file  . Alcohol Use: Not on file  . Drug Use: Not on file  . Sexual Activity: Not on file   Other Topics Concern  . Not on file   Social History Narrative  . No narrative on file    Review  of Systems:  All systems reviewed.  They are negative to the above problem except as previously stated.  Vital Signs: BP 132/73  Pulse 34  Ht 5\' 9"  (1.753 m)  Wt 171 lb (77.565 kg)  BMI 25.24 kg/m2  Physical Exam Patient is in NAD HEENT:  Normocephalic, atraumatic. EOMI, PERRLA.  Neck: JVP is normal.  No bruits.  Lungs: clear to auscultation. No rales no wheezes.  Heart: Regular rate and rhythm. Normal S1, S2. No S3.   No significant murmurs. PMI not displaced.  Abdomen:  Supple, nontender. Normal bowel sounds. No masses. No hepatomegaly.  Extremities:   Good distal pulses throughout. No lower extremity edema.  Musculoskeletal :moving all extremities.  Neuro:   alert and oriented x3.  CN II-XII grossly intact.  EKG  SB with occasional PAC  39 bpm.  First degree AV block (pr interval 204 msec Assessment and Plan:  1.  Bradycardia  Patient remains asymptomatic  WOuld follow  COunselled patient and daughter on symptoms  FOllow in the fall.

## 2013-11-30 ENCOUNTER — Encounter: Payer: Self-pay | Admitting: Internal Medicine

## 2013-12-04 ENCOUNTER — Encounter: Payer: Self-pay | Admitting: Internal Medicine

## 2014-02-21 ENCOUNTER — Ambulatory Visit (INDEPENDENT_AMBULATORY_CARE_PROVIDER_SITE_OTHER): Payer: Medicaid Other | Admitting: Family Medicine

## 2014-02-21 ENCOUNTER — Encounter: Payer: Self-pay | Admitting: Family Medicine

## 2014-02-21 VITALS — BP 141/75 | HR 45 | Temp 97.7°F | Ht 69.0 in | Wt 167.2 lb

## 2014-02-21 DIAGNOSIS — R21 Rash and other nonspecific skin eruption: Secondary | ICD-10-CM | POA: Insufficient documentation

## 2014-02-21 MED ORDER — TRIAMCINOLONE ACETONIDE 0.1 % EX OINT: 1.0000 "application " | TOPICAL_OINTMENT | Freq: Two times a day (BID) | CUTANEOUS | Status: DC

## 2014-02-21 NOTE — Progress Notes (Signed)
   Subjective:    Patient ID: Jesse Christensen, male    DOB: May 23, 1956, 58 y.o.   MRN: [id number]  HPI 58 year old male presents with complaints of rash.  1) Rash - Patient reports that he has had rash since Sunday. - Rash is located exclusively on the arms.  No affected areas on the chest, back, face, etc. - He reports that the rash is not pruritic. - He does not recall any new exposures. No changes in laundry detergent.   - No other individuals affected. - He has been treating it with some "powder" and calamine lotion.  He has had some improvement with this. - No other symptoms. No recent fevers, chills.  He does not recall any bug bites.  Review of Systems Per HPI    Objective:   Physical Exam Filed Vitals:   02/21/14 0958  BP: 141/75  Pulse: 45  Temp: 97.7 F (36.5 C)  General: well appearing male, NAD. Skin: Arms - several linear papular lesions noted on the upper and lower arms. No surrounding erythema. No open wounds or excoriations. No drainage noted.    Assessment & Plan:  See Problem List

## 2014-02-21 NOTE — Assessment & Plan Note (Signed)
Appears to be likely contact versus allergic. Will treat with topical triamcinolone and have patient followup if fails to improve or worsens.

## 2014-03-31 NOTE — Progress Notes (Signed)
HPI Patient is a 58 year old with a history of bradycardia. I set him up for 48 hour Holter last year.  HR ranged from 20 to 125 bpm.  Average HR around 45 bpm.  Longest pause was 4.3 sec at 5:20 AM  I saw the patient in clinic in the spring.    Since seen he has done well  NO CP  No SOB  No dizziness No palpitatons   No Known Allergies   Current Outpatient Prescriptions  Medication Sig Dispense Refill  . docusate sodium (COLACE) 100 MG capsule Take 100 mg by mouth 2 (two) times daily as needed. For constipation       . meloxicam (MOBIC) 15 MG tablet Take 1 tablet (15 mg total) by mouth daily.  30 tablet  1  . risperiDONE (RISPERDAL) 2 MG tablet Take 1.5 mg by mouth at bedtime.        . traZODone (DESYREL) 100 MG tablet Take 100 mg by mouth at bedtime.        . triamcinolone ointment (KENALOG) 0.1 % Apply 1 application topically 2 (two) times daily.  30 g  0   No current facility-administered medications for this visit.    Past Medical History  Diagnosis Date  . SCHIZOPHRENIA   . INSOMNIA-SLEEP DISORDER-UNSPEC   . BRADYCARDIA   . OSTEOARTHRITIS, KNEE   . Edema   . Encounter for long-term (current) use of medications   . Fatigue   . GERD (gastroesophageal reflux disease)   . Knee pain, bilateral   . Pain in joint, shoulder region     No past surgical history on file.  No family history on file.  History   Social History  . Marital Status: Single    Spouse Name: N/A    Number of Children: N/A  . Years of Education: N/A   Occupational History  . Not on file.   Social History Main Topics  . Smoking status: Never Smoker   . Smokeless tobacco: Not on file  . Alcohol Use: Not on file  . Drug Use: Not on file  . Sexual Activity: Not on file   Other Topics Concern  . Not on file   Social History Narrative  . No narrative on file    Review of Systems:  All systems reviewed.  They are negative to the above problem except as previously stated.  Vital Signs: BP  130/70  Pulse 40  Ht 5\' 10"  (1.778 m)  Wt 169 lb (76.658 kg)  BMI 24.25 kg/m2  Physical Exam Patient is in NAD HEENT:  Normocephalic, atraumatic. EOMI, PERRLA.  Neck: JVP is normal.  No bruits.  Lungs: clear to auscultation. No rales no wheezes.  Heart: Regular rate and rhythm. Normal S1, S2. No S3.   No significant murmurs. PMI not displaced.  Abdomen:  Supple, nontender. Normal bowel sounds. No masses. No hepatomegaly.  Extremities:   Good distal pulses throughout. No lower extremity edema.  Musculoskeletal :moving all extremities.  Neuro:   alert and oriented x3.  CN II-XII grossly intact.  EKG  SB with occasional PAC  40 bpm.   Ocassional PAC   First degree AV block (pr interval 236 msec Assessment and Plan:  1.  Bradycardia  Patient remains asymptomatic  WOuld follow  COunselled patient and daughter on symptoms  FOllow in the fall.

## 2014-04-01 ENCOUNTER — Ambulatory Visit (INDEPENDENT_AMBULATORY_CARE_PROVIDER_SITE_OTHER): Payer: Medicaid Other | Admitting: Internal Medicine

## 2014-04-01 ENCOUNTER — Encounter: Payer: Self-pay | Admitting: Internal Medicine

## 2014-04-01 VITALS — BP 130/70 | HR 40 | Ht 70.0 in | Wt 169.0 lb

## 2014-04-01 DIAGNOSIS — I498 Other specified cardiac arrhythmias: Secondary | ICD-10-CM

## 2014-04-01 NOTE — Patient Instructions (Signed)
Your physician wants you to follow-up in: August 2016. You will receive a reminder letter in the mail two months in advance. If you don't receive a letter, please call our office to schedule the follow-up appointment. Your physician recommends that you continue on your current medications as directed. Please refer to the Current Medication list given to you today.

## 2014-04-15 ENCOUNTER — Other Ambulatory Visit: Payer: Self-pay | Admitting: *Deleted

## 2014-04-16 MED ORDER — TRIAMCINOLONE ACETONIDE 0.1 % EX OINT: 1.0000 "application " | TOPICAL_OINTMENT | Freq: Two times a day (BID) | CUTANEOUS | Status: DC

## 2015-03-28 ENCOUNTER — Ambulatory Visit: Payer: Medicaid Other | Admitting: Internal Medicine

## 2015-04-07 ENCOUNTER — Ambulatory Visit (INDEPENDENT_AMBULATORY_CARE_PROVIDER_SITE_OTHER): Payer: Medicaid Other | Admitting: Internal Medicine

## 2015-04-07 ENCOUNTER — Encounter: Payer: Self-pay | Admitting: Internal Medicine

## 2015-04-07 VITALS — BP 138/78 | HR 41 | Ht 70.0 in | Wt 170.0 lb

## 2015-04-07 DIAGNOSIS — R609 Edema, unspecified: Secondary | ICD-10-CM

## 2015-04-07 NOTE — Progress Notes (Signed)
Cardiology Office Note   Date:  04/07/2015   ID:  Jesse Christensen, DOB 1955/08/27, MRN [id number]  PCP:  Nobie Putnam, DO  Cardiologist:   Dorris Carnes, MD   No chief complaint on file.  F/U of bradycardia   History of Present Illness: Jesse Christensen is a 59 y.o. male with a history of bradycardia. I set him up for 48 hour Holter last year. HR ranged from 20 to 125 bpm. Average HR around 45 bpm. Longest pause was 4.3 sec at 5:20 AM  I saw the patient in clinic in the spring.   Since seen he denies dizziness  No SOB No CP      Current Outpatient Prescriptions  Medication Sig Dispense Refill  . docusate sodium (COLACE) 100 MG capsule Take 100 mg by mouth 2 (two) times daily as needed. For constipation     . meloxicam (MOBIC) 15 MG tablet Take 1 tablet (15 mg total) by mouth daily. 30 tablet 1  . risperiDONE (RISPERDAL) 2 MG tablet Take 1.5 mg by mouth at bedtime.      . traZODone (DESYREL) 100 MG tablet Take 100 mg by mouth at bedtime.      . triamcinolone ointment (KENALOG) 0.1 % Apply 1 application topically 2 (two) times daily. 30 g 0   No current facility-administered medications for this visit.    Allergies:   Review of patient's allergies indicates no known allergies.   Past Medical History  Diagnosis Date  . SCHIZOPHRENIA   . INSOMNIA-SLEEP DISORDER-UNSPEC   . BRADYCARDIA   . OSTEOARTHRITIS, KNEE   . Edema   . Encounter for long-term (current) use of medications   . Fatigue   . GERD (gastroesophageal reflux disease)   . Knee pain, bilateral   . Pain in joint, shoulder region     No past surgical history on file.   Social History:  The patient  reports that he has never smoked. He does not have any smokeless tobacco history on file.   Family History:  The patient's family history is not on file.    ROS:  Please see the history of present illness. All other systems are reviewed and  Negative to the above problem except as noted.     PHYSICAL EXAM: VS:  BP 138/78 mmHg  Pulse 41  Ht 5\' 10"  (1.778 m)  Wt 170 lb (77.111 kg)  BMI 24.39 kg/m2  GEN: Well nourished, well developed, in no acute distress HEENT: normal Neck: no JVD, carotid bruits, or masses Cardiac: RRR; no murmurs, rubs, or gallops,no edema  Respiratory:  clear to auscultation bilaterally, normal work of breathing GI: soft, nontender, nondistended, + BS  No hepatomegaly  MS: no deformity Moving all extremities   Skin: warm and dry, no rash Neuro:  Strength and sensation are intact Psych: euthymic mood, full affect   EKG:  EKG is ordered today.  SB 41 bpm  LVH  First degree AV block  PR 240 msec     Lipid Panel    Component Value Date/Time   CHOL 201* 07/31/2008 2117   TRIG 53 07/31/2008 2117   HDL 67 07/31/2008 2117   CHOLHDL 3.0 Ratio 07/31/2008 2117   VLDL 11 07/31/2008 2117   LDLCALC 123* 07/31/2008 2117   LDLDIRECT 97 03/12/2010 2038      Wt Readings from Last 3 Encounters:  04/07/15 170 lb (77.111 kg)  04/01/14 169 lb (76.658 kg)  02/21/14 167 lb 3 oz (75.836 kg)  ASSESSMENT AND PLAN:  1.  Bradycardia. I am not convinced the patient is symptomatic  Continue to follow  No intervention for now.      Signed, Dorris Carnes, MD  04/07/2015 10:35 AM    [address], [place], [place]  [address] Phone: [phone]; Fax: [phone]

## 2015-04-07 NOTE — Patient Instructions (Signed)
Your physician recommends that you continue on your current medications as directed. Please refer to the Current Medication list given to you today. Your physician wants you to follow-up in: 1 YEAR WITH DR. ROSS.  You will receive a reminder letter in the mail two months in advance. If you don't receive a letter, please call our office to schedule the follow-up appointment.  

## 2015-04-08 ENCOUNTER — Encounter: Payer: Self-pay | Admitting: Internal Medicine

## 2015-04-10 ENCOUNTER — Telehealth: Payer: Self-pay | Admitting: *Deleted

## 2015-04-10 NOTE — Telephone Encounter (Signed)
Printed letter written by Dr. Harrington Challenger. Called home number, lady who answered said that Lenoard Aden (listed in demographics) works with the patient. Asked about the EC Trisha Mangle.  She is at work.  I left a voicemail for the [facility] Trisha Mangle to call back.  I am placing the letter at the front desk to be picked up.

## 2015-05-07 ENCOUNTER — Telehealth: Payer: Self-pay | Admitting: Internal Medicine

## 2015-05-07 NOTE — Telephone Encounter (Signed)
Walk in pt form-[facility] clearance dropped off-gave to [employer]

## 2016-04-09 ENCOUNTER — Ambulatory Visit: Payer: Medicaid Other | Admitting: Internal Medicine

## 2016-06-20 NOTE — Progress Notes (Signed)
Cardiology Office Note   Date:  06/21/2016   ID:  Jesse Christensen, DOB Nov 20, 1955, MRN [id number]  PCP:  Vicenta Aly, FNP  Cardiologist:   Dorris Carnes, MD   F/U of bradycardia      History of Present Illness: Jesse Christensen is a 60 y.o. male with a history of bradycardia   I saw him in October 2016  Since seen he denies dizzienss  No CP  No SOB  Walking      Outpatient Medications Prior to Visit  Medication Sig Dispense Refill  . docusate sodium (COLACE) 100 MG capsule Take 100 mg by mouth 2 (two) times daily as needed. For constipation     . meloxicam (MOBIC) 15 MG tablet Take 1 tablet (15 mg total) by mouth daily. 30 tablet 1  . risperiDONE (RISPERDAL) 2 MG tablet Take 1.5 mg by mouth at bedtime.      . traZODone (DESYREL) 100 MG tablet Take 100 mg by mouth at bedtime.      . triamcinolone ointment (KENALOG) 0.1 % Apply 1 application topically 2 (two) times daily. 30 g 0   No facility-administered medications prior to visit.      Allergies:   Patient has no known allergies.   Past Medical History:  Diagnosis Date  . BRADYCARDIA   . Edema   . Encounter for long-term (current) use of medications   . Fatigue   . GERD (gastroesophageal reflux disease)   . INSOMNIA-SLEEP DISORDER-UNSPEC   . Knee pain, bilateral   . OSTEOARTHRITIS, KNEE   . Pain in joint, shoulder region   . SCHIZOPHRENIA     History reviewed. No pertinent surgical history.   Social History:  The patient  reports that he has never smoked. He has never used smokeless tobacco.   Family History:  The patient's family history is not on file.    ROS:  Please see the history of present illness. All other systems are reviewed and  Negative to the above problem except as noted.    PHYSICAL EXAM: VS:  BP 132/80   Pulse (!) 52   Ht 5\' 10"  (1.778 m)   Wt 171 lb 3.2 oz (77.7 kg)   BMI 24.56 kg/m   GEN: Well nourished, well developed, in no acute distress HEENT: normal Neck: no JVD, carotid  bruits, or masses Cardiac: RRR; Gr II/VI systolic murmur LSB  No rubs, or gallops,no edema  Respiratory:  clear to auscultation bilaterally, normal work of breathing GI: soft, nontender, nondistended, + BS  No hepatomegaly  MS: no deformity Moving all extremities   Skin: warm and dry, no rash Neuro:  Strength and sensation are intact Psych: euthymic mood, full affect   EKG:  EKG is ordered today.  Sinus braadycardia  52 bpm  First degree AV blcok  Pr 246 msec  LVH     Lipid Panel    Component Value Date/Time   CHOL 201 (H) 07/31/2008 2117   TRIG 53 07/31/2008 2117   HDL 67 07/31/2008 2117   CHOLHDL 3.0 Ratio 07/31/2008 2117   VLDL 11 07/31/2008 2117   LDLCALC 123 (H) 07/31/2008 2117   LDLDIRECT 97 03/12/2010 2038      Wt Readings from Last 3 Encounters:  06/21/16 171 lb 3.2 oz (77.7 kg)  04/07/15 170 lb (77.1 kg)  04/01/14 169 lb (76.7 kg)      ASSESSMENT AND PLAN:  1  Bradycardia  Asympotmatic  Follow   F/U in 1 year   BP  is OK  Stay active    Current medicines are reviewed at length with the patient today.  The patient does not have concerns regarding medicines.  Signed, Dorris Carnes, MD  06/21/2016 9:03 AM    [address], [place],   [address] Phone: [phone]; Fax: [phone]

## 2016-06-21 ENCOUNTER — Encounter: Payer: Self-pay | Admitting: Internal Medicine

## 2016-06-21 ENCOUNTER — Ambulatory Visit (INDEPENDENT_AMBULATORY_CARE_PROVIDER_SITE_OTHER): Payer: Medicaid Other | Admitting: Internal Medicine

## 2016-06-21 ENCOUNTER — Encounter (INDEPENDENT_AMBULATORY_CARE_PROVIDER_SITE_OTHER): Payer: Self-pay

## 2016-06-21 VITALS — BP 132/80 | HR 52 | Ht 70.0 in | Wt 171.2 lb

## 2016-06-21 DIAGNOSIS — Z87898 Personal history of other specified conditions: Secondary | ICD-10-CM

## 2016-06-21 DIAGNOSIS — Z8679 Personal history of other diseases of the circulatory system: Secondary | ICD-10-CM

## 2016-06-21 NOTE — Patient Instructions (Signed)
Your physician recommends that you continue on your current medications as directed. Please refer to the Current Medication list given to you today. Your physician wants you to follow-up in: 1 year with Dr. Ross.  You will receive a reminder letter in the mail two months in advance. If you don't receive a letter, please call our office to schedule the follow-up appointment.  

## 2020-01-25 ENCOUNTER — Telehealth: Payer: Self-pay

## 2020-01-25 NOTE — Telephone Encounter (Signed)
NOTES ON FILE FROM  [facility] [phone], SENT REFERRAL TO SCHEDULING

## 2020-02-11 ENCOUNTER — Encounter: Payer: Self-pay | Admitting: General Practice

## 2020-04-04 ENCOUNTER — Ambulatory Visit (INDEPENDENT_AMBULATORY_CARE_PROVIDER_SITE_OTHER): Payer: Medicare Other | Admitting: Internal Medicine

## 2020-04-04 ENCOUNTER — Encounter: Payer: Self-pay | Admitting: Internal Medicine

## 2020-04-04 ENCOUNTER — Other Ambulatory Visit: Payer: Self-pay

## 2020-04-04 ENCOUNTER — Telehealth: Payer: Self-pay | Admitting: Radiology

## 2020-04-04 VITALS — BP 138/64 | HR 35 | Ht 70.0 in | Wt 165.4 lb

## 2020-04-04 DIAGNOSIS — R001 Bradycardia, unspecified: Secondary | ICD-10-CM | POA: Diagnosis not present

## 2020-04-04 NOTE — Patient Instructions (Signed)
Medication Instructions:  No changes *If you need a refill on your cardiac medications before your next appointment, please call your pharmacy*   Lab Work: none If you have labs (blood work) drawn today and your tests are completely normal, you will receive your results only by: Marland Kitchen MyChart Message (if you have MyChart) OR . A paper copy in the mail If you have any lab test that is abnormal or we need to change your treatment, we will call you to review the results.   Testing/Procedures: Your physician has requested that you have an exercise tolerance test. For further information please visit [email or web address]. Please also follow instruction sheet, as given.  Your physician has recommended that you wear a Zio heartmonitor. Heart monitors are medical devices that record the heart's electrical activity. Doctors most often use these monitors to diagnose arrhythmias. Arrhythmias are problems with the speed or rhythm of the heartbeat. The monitor is a small, portable device. You can wear one while you do your normal daily activities. This is usually used to diagnose what is causing palpitations/syncope (passing out).   Follow-Up: Follow up depends on test results  Other Instructions none

## 2020-04-04 NOTE — Telephone Encounter (Signed)
Enrolled patient for a 3 day Zio XT monitor to be mailed to patients home. Per ordering Dr. Patient only needs to wear 24 hrs.

## 2020-04-04 NOTE — Progress Notes (Signed)
Cardiology Office Note   Date:  04/04/2020   ID:  Jesse Christensen, DOB [birth year], MRN [id number]  PCP:  Vicenta Aly, [place]  Cardiologist:   Dorris Carnes, MD    Pt presents for f/u of bradycardia      History of Present Illness: Jesse Christensen is a 64 y.o. male with a history of sinus bradycardia   I last saw him in clinic in 2017 The pt was recnetly seen at a [place] clinic   He told provider in April 2021 he almost passed out   EKG done  Showed ectopic atrial rhythm at 33 bpm   Since seen he denies CP   No dizziness  Breathing is OK   Current Meds  Medication Sig  . docusate sodium (COLACE) 100 MG capsule Take 100 mg by mouth 2 (two) times daily as needed. For constipation   . meloxicam (MOBIC) 15 MG tablet Take 1 tablet (15 mg total) by mouth daily.  Marland Kitchen PREVIDENT 5000 PLUS 1.1 % CREA dental cream Take by mouth at bedtime.  . risperiDONE (RISPERDAL) 2 MG tablet Take 1.5 mg by mouth at bedtime.    . traZODone (DESYREL) 100 MG tablet Take 100 mg by mouth at bedtime.    . triamcinolone ointment (KENALOG) 0.1 % Apply 1 application topically 2 (two) times daily.     Allergies:   Patient has no known allergies.   Past Medical History:  Diagnosis Date  . BRADYCARDIA   . Edema   . Encounter for long-term (current) use of medications   . Fatigue   . GERD (gastroesophageal reflux disease)   . INSOMNIA-SLEEP DISORDER-UNSPEC   . Knee pain, bilateral   . OSTEOARTHRITIS, KNEE   . Pain in joint, shoulder region   . SCHIZOPHRENIA     No past surgical history on file.   Social History:  The patient  reports that he has never smoked. He has never used smokeless tobacco.   Family History:  The patient's family history is not on file.    ROS:  Please see the history of present illness. All other systems are reviewed and  Negative to the above problem except as noted.    PHYSICAL EXAM: VS:  BP 138/64   Pulse (!) 35   Ht 5\' 10"  (1.778 m)   Wt 165 lb 6.4 oz (75 kg)   BMI  23.73 kg/m   GEN: Well nourished, well developed, in no acute distress  HEENT: normal  Neck: no JVD, carotid bruits, or masses Cardiac: RRR; no murmurs,  no LE edema  Respiratory:  clear to auscultation bilaterally, GI: soft, nontender, nondistended, + BS  No hepatomegaly  MS: no deformity Moving all extremities   Skin: warm and dry, no rash Neuro:  Strength and sensation are intact Psych: euthymic mood, full affect   EKG:  EKG is ordered today.  Junctional rhythm 35 bpm     Lipid Panel    Component Value Date/Time   CHOL 201 (H) 07/31/2008 2117   TRIG 53 07/31/2008 2117   HDL 67 07/31/2008 2117   CHOLHDL 3.0 Ratio 07/31/2008 2117   VLDL 11 07/31/2008 2117   LDLCALC 123 (H) 07/31/2008 2117   LDLDIRECT 97 03/12/2010 2038      Wt Readings from Last 3 Encounters:  04/04/20 165 lb 6.4 oz (75 kg)  06/21/16 171 lb 3.2 oz (77.7 kg)  04/07/15 170 lb (77.1 kg)      ASSESSMENT AND PLAN:  1    Bradycardia  Pt's HR has slowed since I last saw him in 2017   He denies dizziness   He denies SOB I would recomm 1. Holter monitor to evaluate for pauses, average HR  2   GXT to evaluate for chronotropic competence and exercise tolerance  Continue activies as tolerated   Watch for dizziness   Current medicines are reviewed at length with the patient today.  The patient does not have concerns regarding medicines.  Signed, Dorris Carnes, MD  04/04/2020 1:47 PM    [facility] [address], [place], [place]  [address] Phone: [phone]; Fax: [phone]

## 2020-04-09 ENCOUNTER — Telehealth: Payer: Self-pay | Admitting: Internal Medicine

## 2020-04-09 ENCOUNTER — Other Ambulatory Visit (INDEPENDENT_AMBULATORY_CARE_PROVIDER_SITE_OTHER): Payer: Medicare Other

## 2020-04-09 DIAGNOSIS — R001 Bradycardia, unspecified: Secondary | ICD-10-CM

## 2020-04-09 NOTE — Telephone Encounter (Signed)
    Pt called ask how long pt needs to wear heart monitor. Per notes 09/24 pt need to wear it fir 24 hours

## 2020-04-11 ENCOUNTER — Other Ambulatory Visit (HOSPITAL_COMMUNITY)
Admission: RE | Admit: 2020-04-11 | Discharge: 2020-04-11 | Disposition: A | Discharge status: Home / Self Care | Payer: Medicare Other | Source: Ambulatory Visit | Attending: Internal Medicine | Admitting: Internal Medicine

## 2020-04-11 DIAGNOSIS — Z01812 Encounter for preprocedural laboratory examination: Secondary | ICD-10-CM | POA: Diagnosis present

## 2020-04-11 DIAGNOSIS — Z20822 Contact with and (suspected) exposure to covid-19: Secondary | ICD-10-CM | POA: Diagnosis not present

## 2020-04-11 LAB — SARS CORONAVIRUS 2 (TAT 6-24 HRS): SARS Coronavirus 2: NEGATIVE

## 2020-04-15 ENCOUNTER — Ambulatory Visit (INDEPENDENT_AMBULATORY_CARE_PROVIDER_SITE_OTHER): Payer: Medicare Other

## 2020-04-15 ENCOUNTER — Other Ambulatory Visit: Payer: Self-pay

## 2020-04-15 DIAGNOSIS — R001 Bradycardia, unspecified: Secondary | ICD-10-CM | POA: Diagnosis not present

## 2020-04-15 LAB — EXERCISE TOLERANCE TEST
Estimated workload: 8.5 METS
Exercise duration (min): 6 min
Exercise duration (sec): 31 s
MPHR: 155 {beats}/min
Peak HR: 100 {beats}/min
Percent HR: 64 %
RPE: 16
Rest HR: 36 {beats}/min

## 2020-04-17 ENCOUNTER — Telehealth: Payer: Self-pay | Admitting: Cardiology

## 2020-04-17 NOTE — Telephone Encounter (Signed)
Holter moniorting people called, pt had HR of 39 for 30 seconds on 04/09/20  Will send  To Dr. Harrington Challenger - strips to be sent to office

## 2020-04-25 ENCOUNTER — Telehealth: Payer: Self-pay | Admitting: Internal Medicine

## 2020-04-25 NOTE — Telephone Encounter (Signed)
Spoke with patient.  Informed of recommendations and results.  Follow up scheduled.

## 2020-04-25 NOTE — Telephone Encounter (Signed)
    Pt is returning call for heart monitor result

## 2020-06-19 NOTE — Progress Notes (Incomplete)
Cardiology Office Note   Date:  06/19/2020   ID:  Jesse Christensen, DOB [birth year], MRN [id number]  PCP:  Vicenta Aly, [place]  Cardiologist:   Dorris Carnes, MD    Pt presents for f/u of bradycardia      History of Present Illness: Jesse Christensen is a 64 y.o. male with a history of sinus bradycardia   I last saw him in clinic in 2017 The pt was recnetly seen at a [place] clinic   He told provider in April 2021 he almost passed out   EKG done  Showed ectopic atrial rhythm at 33 bpm   Since seen he denies CP   No dizziness  Breathing is OK   No outpatient medications have been marked as taking for the 06/20/20 encounter (Appointment) with Fay Records, MD.     Allergies:   Patient has no known allergies.   Past Medical History:  Diagnosis Date  . BRADYCARDIA   . Edema   . Encounter for long-term (current) use of medications   . Fatigue   . GERD (gastroesophageal reflux disease)   . INSOMNIA-SLEEP DISORDER-UNSPEC   . Knee pain, bilateral   . OSTEOARTHRITIS, KNEE   . Pain in joint, shoulder region   . SCHIZOPHRENIA     No past surgical history on file.   Social History:  The patient  reports that he has never smoked. He has never used smokeless tobacco.   Family History:  The patient's family history is not on file.    ROS:  Please see the history of present illness. All other systems are reviewed and  Negative to the above problem except as noted.    PHYSICAL EXAM: VS:  There were no vitals taken for this visit.  GEN: Well nourished, well developed, in no acute distress  HEENT: normal  Neck: no JVD, carotid bruits, or masses Cardiac: RRR; no murmurs,  no LE edema  Respiratory:  clear to auscultation bilaterally, GI: soft, nontender, nondistended, + BS  No hepatomegaly  MS: no deformity Moving all extremities   Skin: warm and dry, no rash Neuro:  Strength and sensation are intact Psych: euthymic mood, full affect   EKG:  EKG is ordered today.   Junctional rhythm 35 bpm     Lipid Panel    Component Value Date/Time   CHOL 201 (H) 07/31/2008 2117   TRIG 53 07/31/2008 2117   HDL 67 07/31/2008 2117   CHOLHDL 3.0 Ratio 07/31/2008 2117   VLDL 11 07/31/2008 2117   LDLCALC 123 (H) 07/31/2008 2117   LDLDIRECT 97 03/12/2010 2038      Wt Readings from Last 3 Encounters:  04/04/20 165 lb 6.4 oz (75 kg)  06/21/16 171 lb 3.2 oz (77.7 kg)  04/07/15 170 lb (77.1 kg)      ASSESSMENT AND PLAN:  1    Bradycardia   Pt's HR has slowed since I last saw him in 2017   He denies dizziness   He denies SOB I would recomm 1. Holter monitor to evaluate for pauses, average HR  2   GXT to evaluate for chronotropic competence and exercise tolerance  Continue activies as tolerated   Watch for dizziness   Current medicines are reviewed at length with the patient today.  The patient does not have concerns regarding medicines.  Signed, Dorris Carnes, MD  06/19/2020 9:43 PM    [facility] [address], [place], [place]  [address] Phone: [phone]; Fax: [phone]    

## 2020-06-20 ENCOUNTER — Ambulatory Visit: Payer: Medicare Other | Admitting: Internal Medicine

## 2020-10-10 LAB — COLOGUARD

## 2021-09-17 ENCOUNTER — Ambulatory Visit: Payer: Self-pay | Admitting: General Surgery

## 2021-09-17 NOTE — H&P (Signed)
?Chief Complaint: New Consultation (Multiple Lipomas ) ?  ?  ?  ?History of Present Illness: ?Jesse Christensen is a 66 y.o. male who is seen today as an office consultation at the request of Dr. Solutions for evaluation of New Consultation (Multiple Lipomas ) ?Jesse Christensen   ?Patient is a 66 year old male who comes in secondary to 2 back masses.  He states this been there for a long period of time.  He states he had no signs of infection or drainage from the area.  He does state that he has some pain when he leans back also confirmed. ?  ?Patient's had no previous surgery. ?  ?  ?  ?  ?  ?Review of Systems: ?A complete review of systems was obtained from the patient.  I have reviewed this information and discussed as appropriate with the patient.  See HPI as well for other ROS. ?  ?Review of Systems  ?Constitutional: Negative for fever.  ?HENT: Negative for congestion.   ?Eyes: Negative for blurred vision.  ?Respiratory: Negative for cough, shortness of breath and wheezing.   ?Cardiovascular: Negative for chest pain and palpitations.  ?Gastrointestinal: Negative for heartburn.  ?Genitourinary: Negative for dysuria.  ?Musculoskeletal: Negative for myalgias.  ?Skin: Negative for rash.  ?Neurological: Negative for dizziness and headaches.  ?Psychiatric/Behavioral: Negative for depression and suicidal ideas.  ?All other systems reviewed and are negative. ?  ?  ?  ?Medical History: ?Past Medical History ?Past Medical History: ?Diagnosis Date ? Arthritis   ? GERD (gastroesophageal reflux disease)   ? ?  ?  ?Patient Active Problem List ?Diagnosis ? Bradycardia ? Edema ? Fatigue ? GERD (gastroesophageal reflux disease) ? History of bradycardia ? Knee pain, bilateral ? Lipoma of back ? Lipoma of other specified sites ? Long-term use of high-risk medication ? Nonorganic sleep disorder, unspecified ? Osteoarthrosis, unspecified whether generalized or localized, lower leg ? Osteoarthrosis ? Other long term (current) drug therapy ? Other  specified cardiac arrhythmias ? Pain in joint, lower leg ? Pain in joint, shoulder region ? Personal history of colonic polyps ? Rash ? Schizophrenia (CMS-HCC) ? Urinary hesitancy ?  ?  ?Past Surgical History ?History reviewed. No pertinent surgical history. ?  ?  ?Allergies ?No Known Allergies ? ?  ?Current Outpatient Medications on File Prior to Visit ?Medication Sig Dispense Refill ? risperiDONE (RISPERDAL) 2 MG tablet Take 2 mg by mouth at bedtime     ? traZODone (DESYREL) 100 MG tablet Take 100 mg by mouth at bedtime     ?  ?No current facility-administered medications on file prior to visit. ?  ?  ?Family History ?History reviewed. No pertinent family history. ?  ?  ?Social History ?  ?Tobacco Use ?Smoking Status Never ?Smokeless Tobacco Never ?  ?  ?Social History ?Social History ?  ? ?Socioeconomic History ? Marital status: Single ?Tobacco Use ? Smoking status: Never ? Smokeless tobacco: Never ?Vaping Use ? Vaping Use: Unknown ?Substance and Sexual Activity ? Alcohol use: Not Currently ? Drug use: Never ? ?  ?  ?Objective: ?  ?  ?Vitals: ?  09/17/21 1434 ?BP: (!) 150/90 ?Pulse: (!) 136 ?Temp: 36.3 ?C (97.3 ?F) ?SpO2: 99% ?Weight: 79.4 kg (175 lb) ?Height: 175.3 cm ('5\' 9"'$ ) ?  ?Body mass index is 25.84 kg/m?. ?  ?Physical Exam ?Constitutional:   ?   General: He is not in acute distress. ?   Appearance: Normal appearance.  ?HENT:  ?   Head: Normocephalic.  ?  Nose: No rhinorrhea.  ?   Mouth/Throat:  ?   Mouth: Mucous membranes are moist.  ?   Pharynx: Oropharynx is clear.  ?Eyes:  ?   General: No scleral icterus. ?   Pupils: Pupils are equal, round, and reactive to light.  ?Cardiovascular:  ?   Rate and Rhythm: Normal rate.  ?   Pulses: Normal pulses.  ?Pulmonary:  ?   Effort: Pulmonary effort is normal. No respiratory distress.  ?   Breath sounds: No stridor. No wheezing.  ? ? ?   Comments: Upper back-5x8cm ?Lower back-4x6cm ?Abdominal:  ?   General: Abdomen is flat. There is no distension.  ?   Tenderness:  There is no abdominal tenderness. There is no guarding or rebound.  ?Musculoskeletal:     ?   General: Normal range of motion.  ?   Cervical back: Normal range of motion and neck supple.  ?Skin: ?   General: Skin is warm and dry.  ?   Capillary Refill: Capillary refill takes less than 2 seconds.  ?   Coloration: Skin is not jaundiced.  ?Neurological:  ?   General: No focal deficit present.  ?   Mental Status: He is alert and oriented to person, place, and time. Mental status is at baseline.  ?Psychiatric:     ?   Mood and Affect: Mood normal.     ?   Thought Content: Thought content normal.     ?   Judgment: Judgment normal.  ?  ?  ?  ?  ?Assessment and Plan: ?Diagnoses and all orders for this visit: ?  ?Lipoma of back ?  ?  ?Jesse Christensen is a 66 y.o. male  ?  ?1. We will proceed to the operating for excision of lipoma x2 from the back. ?2. I discussed with him the risk benefits of procedure to include but not limited to: Infection, bleeding, damage surrounding structures, possible need for further surgery.  Patient voiced understanding wishes to proceed. ?  ?  ?  ?  ?  ?No follow-ups on file. ?  ?Ralene Ok, MD, FACS ?[facility], [place] ?General & Minimally Invasive Surgery ?  ? ?

## 2021-09-17 NOTE — H&P (View-Only) (Signed)
?Chief Complaint: New Consultation (Multiple Lipomas ) ?  ?  ?  ?History of Present Illness: ?Jesse Christensen is a 66 y.o. male who is seen today as an office consultation at the request of Dr. [facility] for evaluation of New Consultation (Multiple Lipomas ) ?Jesse Christensen   ?Patient is a 66 year old male who comes in secondary to 2 back masses.  He states this been there for a long period of time.  He states he had no signs of infection or drainage from the area.  He does state that he has some pain when he leans back also confirmed. ?  ?Patient's had no previous surgery. ?  ?  ?  ?  ?  ?Review of Systems: ?A complete review of systems was obtained from the patient.  I have reviewed this information and discussed as appropriate with the patient.  See HPI as well for other ROS. ?  ?Review of Systems  ?Constitutional: Negative for fever.  ?HENT: Negative for congestion.   ?Eyes: Negative for blurred vision.  ?Respiratory: Negative for cough, shortness of breath and wheezing.   ?Cardiovascular: Negative for chest pain and palpitations.  ?Gastrointestinal: Negative for heartburn.  ?Genitourinary: Negative for dysuria.  ?Musculoskeletal: Negative for myalgias.  ?Skin: Negative for rash.  ?Neurological: Negative for dizziness and headaches.  ?Psychiatric/Behavioral: Negative for depression and suicidal ideas.  ?All other systems reviewed and are negative. ?  ?  ?  ?Medical History: ?Past Medical History ?Past Medical History: ?Diagnosis Date ? Arthritis   ? GERD (gastroesophageal reflux disease)   ? ?  ?  ?Patient Active Problem List ?Diagnosis ? Bradycardia ? Edema ? Fatigue ? GERD (gastroesophageal reflux disease) ? History of bradycardia ? Knee pain, bilateral ? Lipoma of back ? Lipoma of other specified sites ? Long-term use of high-risk medication ? Nonorganic sleep disorder, unspecified ? Osteoarthrosis, unspecified whether generalized or localized, lower leg ? Osteoarthrosis ? Other long term (current) drug therapy ? Other  specified cardiac arrhythmias ? Pain in joint, lower leg ? Pain in joint, shoulder region ? Personal history of colonic polyps ? Rash ? Schizophrenia (CMS-HCC) ? Urinary hesitancy ?  ?  ?Past Surgical History ?History reviewed. No pertinent surgical history. ?  ?  ?Allergies ?No Known Allergies ? ?  ?Current Outpatient Medications on File Prior to Visit ?Medication Sig Dispense Refill ? risperiDONE (RISPERDAL) 2 MG tablet Take 2 mg by mouth at bedtime     ? traZODone (DESYREL) 100 MG tablet Take 100 mg by mouth at bedtime     ?  ?No current facility-administered medications on file prior to visit. ?  ?  ?Family History ?History reviewed. No pertinent family history. ?  ?  ?Social History ?  ?Tobacco Use ?Smoking Status Never ?Smokeless Tobacco Never ?  ?  ?Social History ?Social History ?  ? ?Socioeconomic History ? Marital status: Single ?Tobacco Use ? Smoking status: Never ? Smokeless tobacco: Never ?Vaping Use ? Vaping Use: Unknown ?Substance and Sexual Activity ? Alcohol use: Not Currently ? Drug use: Never ? ?  ?  ?Objective: ?  ?  ?Vitals: ?  09/17/21 1434 ?BP: (!) 150/90 ?Pulse: (!) 136 ?Temp: 36.3 ?C (97.3 ?F) ?SpO2: 99% ?Weight: 79.4 kg (175 lb) ?Height: 175.3 cm ('5\' 9"'$ ) ?  ?Body mass index is 25.84 kg/m?. ?  ?Physical Exam ?Constitutional:   ?   General: He is not in acute distress. ?   Appearance: Normal appearance.  ?HENT:  ?   Head: Normocephalic.  ?  Nose: No rhinorrhea.  ?   Mouth/Throat:  ?   Mouth: Mucous membranes are moist.  ?   Pharynx: Oropharynx is clear.  ?Eyes:  ?   General: No scleral icterus. ?   Pupils: Pupils are equal, round, and reactive to light.  ?Cardiovascular:  ?   Rate and Rhythm: Normal rate.  ?   Pulses: Normal pulses.  ?Pulmonary:  ?   Effort: Pulmonary effort is normal. No respiratory distress.  ?   Breath sounds: No stridor. No wheezing.  ? ? ?   Comments: Upper back-5x8cm ?Lower back-4x6cm ?Abdominal:  ?   General: Abdomen is flat. There is no distension.  ?   Tenderness:  There is no abdominal tenderness. There is no guarding or rebound.  ?Musculoskeletal:     ?   General: Normal range of motion.  ?   Cervical back: Normal range of motion and neck supple.  ?Skin: ?   General: Skin is warm and dry.  ?   Capillary Refill: Capillary refill takes less than 2 seconds.  ?   Coloration: Skin is not jaundiced.  ?Neurological:  ?   General: No focal deficit present.  ?   Mental Status: He is alert and oriented to person, place, and time. Mental status is at baseline.  ?Psychiatric:     ?   Mood and Affect: Mood normal.     ?   Thought Content: Thought content normal.     ?   Judgment: Judgment normal.  ?  ?  ?  ?  ?Assessment and Plan: ?Diagnoses and all orders for this visit: ?  ?Lipoma of back ?  ?  ?Jesse Christensen is a 66 y.o. male  ?  ?1. We will proceed to the operating for excision of lipoma x2 from the back. ?2. I discussed with him the risk benefits of procedure to include but not limited to: Infection, bleeding, damage surrounding structures, possible need for further surgery.  Patient voiced understanding wishes to proceed. ?  ?  ?  ?  ?  ?No follow-ups on file. ?  ?Ralene Ok, MD, FACS ?[facility], [place] ?General & Minimally Invasive Surgery ?  ? ?

## 2021-09-24 NOTE — Progress Notes (Addendum)
Surgical Instructions ? ? ? Your procedure is scheduled on Wednesday March 22nd. ? Report to [facility] Main Entrance "A" at 10 A.M., then check in with the Admitting office. ? Call this number if you have problems the morning of surgery: ? [phone] ? ? If you have any questions prior to your surgery date call [phone]: Open Monday-Friday 8am-4pm ? ? ? Remember: ? Do not eat after midnight the night before your surgery ? ?You may drink clear liquids until 9am the morning of your surgery.   ?Clear liquids allowed are: Water, Non-Citrus Juices (without pulp), Carbonated Beverages, Clear Tea, Black Coffee ONLY (NO MILK, CREAM OR POWDERED CREAMER of any kind), and Gatorade ? ? ?Enhanced Recovery after Surgery for Orthopedics ?Enhanced Recovery after Surgery is a protocol used to improve the stress on your body and your recovery after surgery. ? ?Patient Instructions ? ?The day of surgery (if you do NOT have diabetes):  ?Drink ONE (1) Pre-Surgery Clear Ensure by __9___ am the morning of surgery   ?This drink was given to you during your hospital  ?pre-op appointment visit. ?Nothing else to drink after completing the  ?Pre-Surgery Clear Ensure. ? ? ?       If you have questions, please contact your surgeon?s office. ? ?  ? Take these medicines the morning of surgery with A SIP OF WATER: ?Tylenol if needed ?Eye drops if needed ? ?As of today, STOP taking any Aspirin (unless otherwise instructed by your surgeon) Meloxicam, Aleve, Naproxen, Ibuprofen, Motrin, Advil, Goody's, BC's, all herbal medications, fish oil, and all vitamins. ? ?         ?Do not wear jewelry  ?Do not wear lotions, powders, colognes, or deodorant. ?Do not shave 48 hours prior to surgery.  Men may shave face and neck. ?Do not bring valuables to the hospital. ?Do not wear nail polish, gel polish, artificial nails, or any other type of covering on natural nails (fingers and toes) ?If you have artificial nails or gel coating that need to be removed  by a nail salon, please have this removed prior to surgery. Artificial nails or gel coating may interfere with anesthesia's ability to adequately monitor your vital signs. ? ?[place] is not responsible for any belongings or valuables. .  ? ?Do NOT Smoke (Tobacco/Vaping)  24 hours prior to your procedure ? ?If you use a CPAP at night, you may bring your mask for your overnight stay. ?  ?Contacts, glasses, hearing aids, dentures or partials may not be worn into surgery, please bring cases for these belongings ?  ?For patients admitted to the hospital, discharge time will be determined by your treatment team. ?  ?Patients discharged the day of surgery will not be allowed to drive home, and someone needs to stay with them for 24 hours. ? ?NO VISITORS WILL BE ALLOWED IN PRE-OP WHERE PATIENTS ARE PREPPED FOR SURGERY.  ONLY 1 SUPPORT PERSON MAY BE PRESENT IN THE WAITING ROOM WHILE YOU ARE IN SURGERY.  IF YOU ARE TO BE ADMITTED, ONCE YOU ARE IN YOUR ROOM YOU WILL BE ALLOWED TWO (2) VISITORS. 1 (ONE) VISITOR MAY STAY OVERNIGHT BUT MUST ARRIVE TO THE ROOM BY 8pm.  Minor children may have two parents present. Special consideration for safety and communication needs will be reviewed on a case by case basis. ? ?Special instructions:   ? ?Oral Hygiene is also important to reduce your risk of infection.  Remember - BRUSH YOUR TEETH THE MORNING OF SURGERY  WITH YOUR REGULAR TOOTHPASTE ? ? ?[facility]- Preparing For Surgery ? ?Before surgery, you can play an important role. Because skin is not sterile, your skin needs to be as free of germs as possible. You can reduce the number of germs on your skin by washing with CHG (chlorahexidine gluconate) Soap before surgery.  CHG is an antiseptic cleaner which kills germs and bonds with the skin to continue killing germs even after washing.   ? ? ?Please do not use if you have an allergy to CHG or antibacterial soaps. If your skin becomes reddened/irritated stop using the CHG.  ?Do not  shave (including legs and underarms) for at least 48 hours prior to first CHG shower. It is OK to shave your face. ? ?Please follow these instructions carefully. ?  ? ? Shower the NIGHT BEFORE SURGERY and the MORNING OF SURGERY with CHG Soap.  ? If you chose to wash your hair, wash your hair first as usual with your normal shampoo. After you shampoo, rinse your hair and body thoroughly to remove the shampoo.  Then [employer] and genitals (private parts) with your normal soap and rinse thoroughly to remove soap. ? ?After that Use CHG Soap as you would any other liquid soap. You can apply CHG directly to the skin and wash gently with a scrungie or a clean washcloth.  ? ?Apply the CHG Soap to your body ONLY FROM THE NECK DOWN.  Do not use on open wounds or open sores. Avoid contact with your eyes, ears, mouth and genitals (private parts). Wash Face and genitals (private parts)  with your normal soap.  ? ?Wash thoroughly, paying special attention to the area where your surgery will be performed. ? ?Thoroughly rinse your body with warm water from the neck down. ? ?DO NOT shower/wash with your normal soap after using and rinsing off the CHG Soap. ? ?Pat yourself dry with a CLEAN TOWEL. ? ?Wear CLEAN PAJAMAS to bed the night before surgery ? ?Place CLEAN SHEETS on your bed the night before your surgery ? ?DO NOT SLEEP WITH PETS. ? ? ?Day of Surgery: ? ?Take a shower with CHG soap. ?Wear Clean/Comfortable clothing the morning of surgery ?Do not apply any deodorants/lotions.   ?Remember to brush your teeth WITH YOUR REGULAR TOOTHPASTE. ? ? ? ?COVID testing ? ?If you are going to stay overnight or be admitted after your procedure/surgery and require a pre-op COVID test, please follow these instructions after your COVID test  ? ?You are not required to quarantine however you are required to wear a well-fitting mask when you are out and around people not in your household.  If your mask becomes wet or soiled, replace with a new  one. ? ?Wash your hands often with soap and water for 20 seconds or clean your hands with an alcohol-based hand sanitizer that contains at least 60% alcohol. ? ?Do not share personal items. ? ?Notify your provider: ?if you are in close contact with someone who has COVID  ?or if you develop a fever of 100.4 or greater, sneezing, cough, sore throat, shortness of breath or body aches. ? ?  ?Please read over the following fact sheets that you were given.  ? ?

## 2021-09-25 ENCOUNTER — Encounter (HOSPITAL_COMMUNITY)
Admission: RE | Admit: 2021-09-25 | Discharge: 2021-09-25 | Disposition: A | Discharge status: Home / Self Care | Payer: Medicare Other | Source: Ambulatory Visit | Attending: General Surgery | Admitting: General Surgery

## 2021-09-25 ENCOUNTER — Encounter (HOSPITAL_COMMUNITY): Payer: Self-pay

## 2021-09-25 ENCOUNTER — Other Ambulatory Visit: Payer: Self-pay

## 2021-09-25 VITALS — BP 140/99 | HR 75 | Temp 97.9°F | Ht 69.0 in | Wt 177.1 lb

## 2021-09-25 DIAGNOSIS — Z01812 Encounter for preprocedural laboratory examination: Secondary | ICD-10-CM | POA: Diagnosis present

## 2021-09-25 DIAGNOSIS — D171 Benign lipomatous neoplasm of skin and subcutaneous tissue of trunk: Secondary | ICD-10-CM | POA: Diagnosis not present

## 2021-09-25 HISTORY — DX: Essential (primary) hypertension: I10

## 2021-09-25 LAB — CBC
HCT: 44.8 % (ref 39.0–52.0)
Hemoglobin: 15.6 g/dL (ref 13.0–17.0)
MCH: 30.8 pg (ref 26.0–34.0)
MCHC: 34.8 g/dL (ref 30.0–36.0)
MCV: 88.5 fL (ref 80.0–100.0)
Platelets: 270 10*3/uL (ref 150–400)
RBC: 5.06 MIL/uL (ref 4.22–5.81)
RDW: 12.7 % (ref 11.5–15.5)
WBC: 4.9 10*3/uL (ref 4.0–10.5)
nRBC: 0 % (ref 0.0–0.2)

## 2021-09-25 NOTE — Progress Notes (Addendum)
PCP - Jesse Aly FNP ?Cardiologist - Jesse Carnes, MD ? ?Chest x-ray - Not indicated ?EKG - Not indicated ?Stress Test - 04/15/2020 ?ECHO - Not sure, didn't see in history ?Cardiac Cath - Not sure, didn't see in history ? ?Sleep Study - Denies ? ?DM - Denies ? ?ERAS Protcol -Yes  ?PRE-SURGERY Ensure   ? ?Aspirin '81mg'$  - Requested for him to contact Dr. Rosendo Gros for instruction on when to stop. ? ?COVID TEST- Not indicated ? ? ?Anesthesia review: Yes h/o of SB. Patient has not had any dizziness, SOB, Chest pain, or chest tightness.  Activity has been normal for his walking and going up stairs. ? ?Patient denies shortness of breath, fever, cough and chest pain at PAT appointment ? ? ?All instructions explained to the patient, with a verbal understanding of the material. Patient agrees to go over the instructions while at home for a better understanding.  The opportunity to ask questions was provided. ? ? ?

## 2021-09-28 NOTE — Anesthesia Preprocedure Evaluation (Addendum)
Anesthesia Evaluation  ?Patient identified by MRN, date of birth, ID band ?Patient awake ? ? ? ?Reviewed: ?Allergy & Precautions, H&P , NPO status , Patient's Chart, lab work & pertinent test results ? ?Airway ?Mallampati: II ? ?TM Distance: >3 FB ?Neck ROM: Full ? ? ? Dental ?no notable dental hx. ?(+) Edentulous Upper, Dental Advisory Given ?  ?Pulmonary ?neg pulmonary ROS,  ?  ?Pulmonary exam normal ?breath sounds clear to auscultation ? ? ? ? ? ? Cardiovascular ?hypertension, Pt. on medications ? ?Rhythm:Regular Rate:Bradycardia ? ? ?  ?Neuro/Psych ?negative neurological ROS ? negative psych ROS  ? GI/Hepatic ?Neg liver ROS, GERD  ,  ?Endo/Other  ?negative endocrine ROS ? Renal/GU ?negative Renal ROS  ?negative genitourinary ?  ?Musculoskeletal ? ?(+) Arthritis , Osteoarthritis,   ? Abdominal ?  ?Peds ? Hematology ?negative hematology ROS ?(+)   ?Anesthesia Other Findings ? ? Reproductive/Obstetrics ?negative OB ROS ? ?  ? ? ? ? ? ? ? ? ? ? ? ? ? ?  ?  ? ? ? ? ? ? ?Anesthesia Physical ?Anesthesia Plan ? ?ASA: 2 ? ?Anesthesia Plan: General  ? ?Post-op Pain Management: Tylenol PO (pre-op)*  ? ?Induction: Intravenous ? ?PONV Risk Score and Plan: 3 and Ondansetron, Dexamethasone and Midazolam ? ?Airway Management Planned: Oral ETT ? ?Additional Equipment:  ? ?Intra-op Plan:  ? ?Post-operative Plan: Extubation in OR ? ?Informed Consent: I have reviewed the patients History and Physical, chart, labs and discussed the procedure including the risks, benefits and alternatives for the proposed anesthesia with the patient or authorized representative who has indicated his/her understanding and acceptance.  ? ? ? ?Dental advisory given ? ?Plan Discussed with: CRNA ? ?Anesthesia Plan Comments: (PAT note by Karoline Caldwell, PA-C: ?Patient previously evaluated by cardiology for bradycardia.  Seen by Dr. Harrington Challenger 2021.  At that time event monitor and exercise tolerance test were ordered.  ETT showed  fair exercise tolerance, no chest pain, resting heart rate 36 with junctional escape beat that increased to a maximum of 93 bpm, no ST changes suggestive of ischemia.  Event monitor showed sinus rhythm with rates of 27 to 76 bpm and average heart rate of 36 bpm.  Patient was cautioned to monitor for any dizziness, shortness of breath, chest tightness. ? ?At preop visit patient denied having any dizziness, shortness of breath, chest pain, or chest tightness.  States he is able to go up 2 flights of stairs without issue.  No change in his functional status. ? ?Preop labs reviewed, unremarkable. ? ?EKG 04/04/2020: Junctional rhythm.  Rate 35. ? ?Exercise tolerance test 04/15/2020: ?? Blood pressure demonstrated a hypertensive response to exercise. ?? There was no ST segment deviation noted during stress. ?? ?ETT with fair exercise tolerance (6:31); no chest pain; hypertensive blood pressure response; resting heart rate 36 with junctional escape beat that increased to a maximum of 93 bpm; no ST changes. ?Patient appears to demonstrate chronotropic competence and no ST changes suggestive of ischemia. ?? ?Event monitor 04/18/2020: ?Sinur rhythm ?Rates 27 to 76 bpm ?Average HR 36 bpm ?Longest pause 3.1 seconds (early AM) ? ?)  ? ? ? ? ? ?Anesthesia Quick Evaluation ? ?

## 2021-09-28 NOTE — Progress Notes (Signed)
Anesthesia Chart Review: ? ?Patient previously evaluated by cardiology for bradycardia.  Seen by Dr. Harrington Challenger 2021.  At that time event monitor and exercise tolerance test were ordered.  ETT showed fair exercise tolerance, no chest pain, resting heart rate 36 with junctional escape beat that increased to a maximum of 93 bpm, no ST changes suggestive of ischemia.  Event monitor showed sinus rhythm with rates of 27 to 76 bpm and average heart rate of 36 bpm.  Patient was cautioned to monitor for any dizziness, shortness of breath, chest tightness. ? ?At preop visit patient denied having any dizziness, shortness of breath, chest pain, or chest tightness.  States he is able to go up 2 flights of stairs without issue.  No change in his functional status. ? ?Preop labs reviewed, unremarkable. ? ?EKG 04/04/2020: Junctional rhythm.  Rate 35. ? ?Exercise tolerance test 04/15/2020: ?Blood pressure demonstrated a hypertensive response to exercise. ?There was no ST segment deviation noted during stress. ?  ?ETT with fair exercise tolerance (6:31); no chest pain; hypertensive blood pressure response; resting heart rate 36 with junctional escape beat that increased to a maximum of 93 bpm; no ST changes.  Patient appears to demonstrate chronotropic competence and no ST changes suggestive of ischemia. ?  ?Event monitor 04/18/2020: ?Sinur rhythm  Rates 27 to 76 bpm ?Average HR 36 bpm ?Longest pause 3.1 seconds (early AM) ? ? ? ?Karoline Caldwell, PA-C ?[facility]/Anesthesiology ?Phone [phone] ?09/28/2021 3:38 PM ? ?

## 2021-09-30 ENCOUNTER — Other Ambulatory Visit: Payer: Self-pay

## 2021-09-30 ENCOUNTER — Encounter (HOSPITAL_COMMUNITY)
Admission: RE | Disposition: A | Discharge status: Home / Self Care | Payer: Self-pay | Source: Home / Self Care | Attending: General Surgery

## 2021-09-30 ENCOUNTER — Encounter (HOSPITAL_COMMUNITY): Payer: Self-pay | Admitting: General Surgery

## 2021-09-30 ENCOUNTER — Ambulatory Visit (HOSPITAL_COMMUNITY): Payer: Medicare Other | Admitting: Emergency Medicine

## 2021-09-30 ENCOUNTER — Ambulatory Visit (HOSPITAL_BASED_OUTPATIENT_CLINIC_OR_DEPARTMENT_OTHER): Payer: Medicare Other | Admitting: Anesthesiology

## 2021-09-30 ENCOUNTER — Ambulatory Visit (HOSPITAL_COMMUNITY)
Admission: RE | Admit: 2021-09-30 | Discharge: 2021-09-30 | Disposition: A | Discharge status: Home / Self Care | Payer: Medicare Other | Attending: General Surgery | Admitting: General Surgery

## 2021-09-30 DIAGNOSIS — D1739 Benign lipomatous neoplasm of skin and subcutaneous tissue of other sites: Secondary | ICD-10-CM | POA: Diagnosis not present

## 2021-09-30 DIAGNOSIS — D171 Benign lipomatous neoplasm of skin and subcutaneous tissue of trunk: Secondary | ICD-10-CM | POA: Insufficient documentation

## 2021-09-30 HISTORY — PX: LIPOMA EXCISION: SHX5283

## 2021-09-30 LAB — BASIC METABOLIC PANEL
Anion gap: 6 (ref 5–15)
BUN: 7 mg/dL — ABNORMAL LOW (ref 8–23)
CO2: 28 mmol/L (ref 22–32)
Calcium: 9 mg/dL (ref 8.9–10.3)
Chloride: 105 mmol/L (ref 98–111)
Creatinine, Ser: 1.02 mg/dL (ref 0.61–1.24)
GFR, Estimated: >60 mL/min (ref >60)
Glucose, Bld: 95 mg/dL (ref 70–99)
Potassium: 3.7 mmol/L (ref 3.5–5.1)
Sodium: 139 mmol/L (ref 135–145)

## 2021-09-30 SURGERY — EXCISION LIPOMA
Anesthesia: General | Site: Back

## 2021-09-30 MED ORDER — PROPOFOL 10 MG/ML IV BOLUS
INTRAVENOUS | Status: DC | PRN
Start: 2021-09-30 — End: 2021-09-30
  Administered 2021-09-30: 100 mg via INTRAVENOUS
  Administered 2021-09-30: 50 mg via INTRAVENOUS

## 2021-09-30 MED ORDER — CEFAZOLIN SODIUM-DEXTROSE 2-4 GM/100ML-% IV SOLN
INTRAVENOUS | Status: AC
  Filled 2021-09-30: qty 100

## 2021-09-30 MED ORDER — ACETAMINOPHEN 500 MG PO TABS
ORAL_TABLET | ORAL | Status: AC
  Administered 2021-09-30: 1000 mg via ORAL
  Filled 2021-09-30: qty 2

## 2021-09-30 MED ORDER — DEXAMETHASONE SODIUM PHOSPHATE 10 MG/ML IJ SOLN
INTRAMUSCULAR | Status: DC | PRN
  Administered 2021-09-30: 5 mg via INTRAVENOUS

## 2021-09-30 MED ORDER — 0.9 % SODIUM CHLORIDE (POUR BTL) OPTIME
TOPICAL | Status: DC | PRN
  Administered 2021-09-30: 1000 mL

## 2021-09-30 MED ORDER — SUCCINYLCHOLINE CHLORIDE 200 MG/10ML IV SOSY
PREFILLED_SYRINGE | INTRAVENOUS | Status: DC | PRN
  Administered 2021-09-30: 100 mg via INTRAVENOUS

## 2021-09-30 MED ORDER — BUPIVACAINE-EPINEPHRINE 0.25% -1:200000 IJ SOLN
INTRAMUSCULAR | Status: DC | PRN
  Administered 2021-09-30: 10 mL

## 2021-09-30 MED ORDER — CHLORHEXIDINE GLUCONATE 0.12 % MT SOLN
OROMUCOSAL | Status: AC
  Administered 2021-09-30: 15 mL via OROMUCOSAL
  Filled 2021-09-30: qty 15

## 2021-09-30 MED ORDER — LIDOCAINE 2% (20 MG/ML) 5 ML SYRINGE
INTRAMUSCULAR | Status: DC | PRN
  Administered 2021-09-30: 60 mg via INTRAVENOUS

## 2021-09-30 MED ORDER — ACETAMINOPHEN 500 MG PO TABS: 1000.0000 mg | ORAL_TABLET | ORAL | Status: AC

## 2021-09-30 MED ORDER — BUPIVACAINE-EPINEPHRINE (PF) 0.25% -1:200000 IJ SOLN
INTRAMUSCULAR | Status: AC
  Filled 2021-09-30: qty 30

## 2021-09-30 MED ORDER — PROPOFOL 10 MG/ML IV BOLUS
INTRAVENOUS | Status: AC
  Filled 2021-09-30: qty 20

## 2021-09-30 MED ORDER — FENTANYL CITRATE (PF) 250 MCG/5ML IJ SOLN
INTRAMUSCULAR | Status: AC
  Filled 2021-09-30: qty 5

## 2021-09-30 MED ORDER — SUCCINYLCHOLINE CHLORIDE 200 MG/10ML IV SOSY
PREFILLED_SYRINGE | INTRAVENOUS | Status: AC
  Filled 2021-09-30: qty 10

## 2021-09-30 MED ORDER — ENSURE PRE-SURGERY PO LIQD: 296.0000 mL | Freq: Once | ORAL | Status: DC

## 2021-09-30 MED ORDER — MIDAZOLAM HCL 2 MG/2ML IJ SOLN
INTRAMUSCULAR | Status: AC
  Filled 2021-09-30: qty 2

## 2021-09-30 MED ORDER — ONDANSETRON HCL 4 MG/2ML IJ SOLN
INTRAMUSCULAR | Status: DC | PRN
  Administered 2021-09-30: 4 mg via INTRAVENOUS

## 2021-09-30 MED ORDER — EPHEDRINE SULFATE-NACL 50-0.9 MG/10ML-% IV SOSY
PREFILLED_SYRINGE | INTRAVENOUS | Status: DC | PRN
  Administered 2021-09-30 (×3): 5 mg via INTRAVENOUS

## 2021-09-30 MED ORDER — LACTATED RINGERS IV SOLN
INTRAVENOUS | Status: DC
Start: 2021-09-30 — End: 2021-09-30

## 2021-09-30 MED ORDER — FENTANYL CITRATE (PF) 250 MCG/5ML IJ SOLN
INTRAMUSCULAR | Status: DC | PRN
  Administered 2021-09-30 (×2): 25 ug via INTRAVENOUS
  Administered 2021-09-30: 50 ug via INTRAVENOUS

## 2021-09-30 MED ORDER — CEFAZOLIN SODIUM-DEXTROSE 2-4 GM/100ML-% IV SOLN
2.0000 g | INTRAVENOUS | Status: AC
  Administered 2021-09-30: 2 g via INTRAVENOUS

## 2021-09-30 MED ORDER — CHLORHEXIDINE GLUCONATE CLOTH 2 % EX PADS
6.0000 | MEDICATED_PAD | Freq: Once | CUTANEOUS | Status: DC
Start: 2021-09-30 — End: 2021-09-30

## 2021-09-30 MED ORDER — ORAL CARE MOUTH RINSE: 15.0000 mL | Freq: Once | OROMUCOSAL | Status: AC

## 2021-09-30 MED ORDER — ONDANSETRON HCL 4 MG/2ML IJ SOLN
INTRAMUSCULAR | Status: AC
  Filled 2021-09-30: qty 2

## 2021-09-30 MED ORDER — CHLORHEXIDINE GLUCONATE 0.12 % MT SOLN: 15.0000 mL | Freq: Once | OROMUCOSAL | Status: AC

## 2021-09-30 MED ORDER — DEXAMETHASONE SODIUM PHOSPHATE 10 MG/ML IJ SOLN
INTRAMUSCULAR | Status: AC
  Filled 2021-09-30: qty 1

## 2021-09-30 MED ORDER — TRAMADOL HCL 50 MG PO TABS: 50.0000 mg | ORAL_TABLET | Freq: Four times a day (QID) | ORAL | 0 refills | Status: AC | PRN

## 2021-09-30 MED ORDER — MIDAZOLAM HCL 2 MG/2ML IJ SOLN
INTRAMUSCULAR | Status: DC | PRN
  Administered 2021-09-30: 2 mg via INTRAVENOUS

## 2021-09-30 MED ORDER — CHLORHEXIDINE GLUCONATE CLOTH 2 % EX PADS: 6.0000 | MEDICATED_PAD | Freq: Once | CUTANEOUS | Status: DC

## 2021-09-30 MED ORDER — LIDOCAINE 2% (20 MG/ML) 5 ML SYRINGE
INTRAMUSCULAR | Status: AC
  Filled 2021-09-30: qty 5

## 2021-09-30 SURGICAL SUPPLY — 32 items
ADH SKN CLS APL DERMABOND .7 (GAUZE/BANDAGES/DRESSINGS) ×1
APL PRP STRL LF DISP 70% ISPRP (MISCELLANEOUS) ×1
CHLORAPREP W/TINT 26 (MISCELLANEOUS) ×2 IMPLANT
COVER SURGICAL LIGHT HANDLE (MISCELLANEOUS) ×2 IMPLANT
DERMABOND ADVANCED (GAUZE/BANDAGES/DRESSINGS) ×1
DERMABOND ADVANCED .7 DNX12 (GAUZE/BANDAGES/DRESSINGS) IMPLANT
DRAPE LAPAROTOMY 100X72 PEDS (DRAPES) ×1 IMPLANT
ELECT REM PT RETURN 9FT ADLT (ELECTROSURGICAL) ×2
ELECTRODE REM PT RTRN 9FT ADLT (ELECTROSURGICAL) ×1 IMPLANT
GLOVE SRG 8 PF TXTR STRL LF DI (GLOVE) ×1 IMPLANT
GLOVE SURG SYN 7.5  E (GLOVE) ×2
GLOVE SURG SYN 7.5 E (GLOVE) ×1 IMPLANT
GLOVE SURG SYN 7.5 PF PI (GLOVE) ×1 IMPLANT
GLOVE SURG UNDER POLY LF SZ8 (GLOVE) ×2
GOWN STRL REUS W/ TWL LRG LVL3 (GOWN DISPOSABLE) ×1 IMPLANT
GOWN STRL REUS W/ TWL XL LVL3 (GOWN DISPOSABLE) ×1 IMPLANT
GOWN STRL REUS W/TWL LRG LVL3 (GOWN DISPOSABLE) ×2
GOWN STRL REUS W/TWL XL LVL3 (GOWN DISPOSABLE) ×2
KIT BASIN OR (CUSTOM PROCEDURE TRAY) ×2 IMPLANT
KIT TURNOVER KIT B (KITS) ×2 IMPLANT
NDL HYPO 25GX1X1/2 BEV (NEEDLE) ×1 IMPLANT
NEEDLE HYPO 25GX1X1/2 BEV (NEEDLE) ×2 IMPLANT
NS IRRIG 1000ML POUR BTL (IV SOLUTION) ×2 IMPLANT
PACK GENERAL/GYN (CUSTOM PROCEDURE TRAY) ×2 IMPLANT
PAD ARMBOARD 7.5X6 YLW CONV (MISCELLANEOUS) ×2 IMPLANT
PENCIL SMOKE EVACUATOR (MISCELLANEOUS) ×2 IMPLANT
SPECIMEN JAR SMALL (MISCELLANEOUS) ×2 IMPLANT
SUT MNCRL AB 4-0 PS2 18 (SUTURE) ×2 IMPLANT
SUT VIC AB 3-0 SH 18 (SUTURE) ×2 IMPLANT
SYR CONTROL 10ML LL (SYRINGE) ×2 IMPLANT
TOWEL GREEN STERILE (TOWEL DISPOSABLE) ×2 IMPLANT
TOWEL GREEN STERILE FF (TOWEL DISPOSABLE) ×2 IMPLANT

## 2021-09-30 NOTE — Transfer of Care (Signed)
Immediate Anesthesia Transfer of Care Note ? ?Patient: Jesse Christensen ? ?Procedure(s) Performed: EXCISION BACK LIPOMA x1 (Back) ? ?Patient Location: PACU ? ?Anesthesia Type:General ? ?Level of Consciousness: awake, alert  and oriented ? ?Airway & Oxygen Therapy: Patient Spontanous Breathing and Patient connected to face mask oxygen ? ?Post-op Assessment: Report given to RN and Post -op Vital signs reviewed and stable ? ?Post vital signs: Reviewed and stable ? ?Last Vitals:  ?Vitals Value Taken Time  ?BP 122/63 09/30/21 1118  ?Temp    ?Pulse 51 09/30/21 1119  ?Resp 13 09/30/21 1119  ?SpO2 98 % 09/30/21 1119  ?Vitals shown include unvalidated device data. ? ?Last Pain:  ?Vitals:  ? 09/30/21 0946  ?TempSrc:   ?PainSc: 0-No pain  ?   ? ?Patients Stated Pain Goal: 0 (09/30/21 0946) ? ?Complications: No notable events documented. ?

## 2021-09-30 NOTE — Interval H&P Note (Signed)
History and Physical Interval Note: ? ?09/30/2021 ?9:49 AM ? ?Jesse Christensen  has presented today for surgery, with the diagnosis of BACK LIPOMA X2.  The various methods of treatment have been discussed with the patient and family. After consideration of risks, benefits and other options for treatment, the patient has consented to  Procedure(s): ?EXCISION BACK LIPOMA x2 (N/A) as a surgical intervention.  The patient's history has been reviewed, patient examined, no change in status, stable for surgery.  I have reviewed the patient's chart and labs.  Questions were answered to the patient's satisfaction.   ? ? ?Ralene Ok ? ? ?

## 2021-09-30 NOTE — Op Note (Signed)
09/30/2021 ? ?11:07 AM ? ?PATIENT:  Jesse Christensen  66 y.o. male ? ?PRE-OPERATIVE DIAGNOSIS:  BACK LIPOMA X ? ?POST-OPERATIVE DIAGNOSIS:  Back Lipoma x 1, 8.5x6x2cm ? ?PROCEDURE:  Procedure(s): ?EXCISION BACK LIPOMA x1 (N/A) ? ?SURGEON:  Surgeon(s) and Role: ?   Ralene Ok, MD - Primary ? ?ANESTHESIA:   local and general ? ?EBL:  minimal  ? ?BLOOD ADMINISTERED:none ? ?DRAINS: none  ? ?LOCAL MEDICATIONS USED:  BUPIVICAINE  ? ?SPECIMEN:  Source of Specimen:  back lipoam 8.5x6x2cm ? ?DISPOSITION OF SPECIMEN:  PATHOLOGY ? ?COUNTS:  YES ? ?TOURNIQUET:  * No tourniquets in log * ? ?DICTATION: .Dragon Dictation ? ?After the patient was consented she was taken to OR and placed in supine position with bilateral SCDs in place. He underwent GETA anesthesia. He was in place in the lateral position. Patient was prepped and draped in standard fashion. Timeout was called all facts verified. ? ?Quarter percent Marcaine with epinephrine was then used to create a field block around the mass.  A 4cm incision was made over the area of the mass.  I proceeded to dissect around the mass.   This was dissected down sharply and circumferentially down to healthy fat tissue. This was excised.  It measured 8.5x6x2cm. This was sent to pathology. Cautery was used to maintain hemostasis. At this time 3-0 Vicryl used to reapproximate the deep dermal layers. 2-0 Monocryl was used to reapproximate the skin in a subcuticular fashion. The skin was dressed with Dermabond. The patient tied the procedure well was taken to recovery room stable condition. ? ? ?PLAN OF CARE: Discharge to home after PACU ? ?PATIENT DISPOSITION:  PACU - hemodynamically stable. ?  ?Delay start of Pharmacological VTE agent (>24hrs) due to surgical blood loss or risk of bleeding: not applicable ? ?

## 2021-09-30 NOTE — Anesthesia Postprocedure Evaluation (Signed)
Anesthesia Post Note ? ?Patient: Rad Gramling ? ?Procedure(s) Performed: EXCISION BACK LIPOMA x1 (Back) ? ?  ? ?Patient location during evaluation: PACU ?Anesthesia Type: General ?Level of consciousness: awake and alert ?Pain management: pain level controlled ?Vital Signs Assessment: post-procedure vital signs reviewed and stable ?Respiratory status: spontaneous breathing, nonlabored ventilation and respiratory function stable ?Cardiovascular status: blood pressure returned to baseline and stable ?Postop Assessment: no apparent nausea or vomiting ?Anesthetic complications: no ? ? ?No notable events documented. ? ?Last Vitals:  ?Vitals:  ? 09/30/21 1148 09/30/21 1203  ?BP: 101/62 122/65  ?Pulse: (!) 48 (!) 45  ?Resp: 16 17  ?Temp:  36.6 ?C  ?SpO2: 96% 98%  ?  ?Last Pain:  ?Vitals:  ? 09/30/21 1203  ?TempSrc:   ?PainSc: 0-No pain  ? ? ?  ?  ?  ?  ?  ?  ? ?Gottlieb Zuercher,W. EDMOND ? ? ? ? ?

## 2021-09-30 NOTE — Anesthesia Procedure Notes (Signed)
Procedure Name: Intubation ?Date/Time: 09/30/2021 10:34 AM ?Performed by: Alain Marion, CRNA ?Pre-anesthesia Checklist: Patient identified, Emergency Drugs available, Suction available and Patient being monitored ?Patient Re-evaluated:Patient Re-evaluated prior to induction ?Oxygen Delivery Method: Circle System Utilized ?Preoxygenation: Pre-oxygenation with 100% oxygen ?Induction Type: IV induction ?Ventilation: Mask ventilation without difficulty ?Laryngoscope Size: Sabra Heck and 2 ?Grade View: Grade I ?Tube type: Oral ?Tube size: 7.5 mm ?Number of attempts: 1 ?Airway Equipment and Method: Stylet and Oral airway ?Placement Confirmation: ETT inserted through vocal cords under direct vision, positive ETCO2 and breath sounds checked- equal and bilateral ?Secured at: 22 cm ?Tube secured with: Tape ?Dental Injury: Teeth and Oropharynx as per pre-operative assessment  ? ? ? ? ?

## 2021-10-01 ENCOUNTER — Encounter (HOSPITAL_COMMUNITY): Payer: Self-pay | Admitting: General Surgery

## 2021-10-01 LAB — SURGICAL PATHOLOGY
